# Patient Record
Sex: Male | Born: 1950
Health system: Southern US, Community
[De-identification: ages and names within clinical notes are randomized; demographics above are authoritative.]

## PROBLEM LIST (undated history)

## (undated) DIAGNOSIS — R251 Tremor, unspecified: Secondary | ICD-10-CM

## (undated) DIAGNOSIS — M109 Gout, unspecified: Secondary | ICD-10-CM

## (undated) DIAGNOSIS — E785 Hyperlipidemia, unspecified: Secondary | ICD-10-CM

## (undated) DIAGNOSIS — E119 Type 2 diabetes mellitus without complications: Secondary | ICD-10-CM

## (undated) DIAGNOSIS — I10 Essential (primary) hypertension: Secondary | ICD-10-CM

## (undated) DIAGNOSIS — E669 Obesity, unspecified: Secondary | ICD-10-CM

## (undated) HISTORY — PX: HERNIA REPAIR: SHX51

## (undated) HISTORY — DX: Tremor, unspecified: R25.1

## (undated) HISTORY — DX: Obesity, unspecified: E66.9

## (undated) HISTORY — DX: Type 2 diabetes mellitus without complications: E11.9

## (undated) HISTORY — DX: Gout, unspecified: M10.9

## (undated) HISTORY — DX: Hyperlipidemia, unspecified: E78.5

## (undated) HISTORY — DX: Essential (primary) hypertension: I10

---

## 1998-12-13 ENCOUNTER — Ambulatory Visit (HOSPITAL_COMMUNITY): Admission: RE | Admit: 1998-12-13 | Discharge: 1998-12-13 | Payer: Self-pay | Admitting: Gastroenterology

## 2010-06-05 ENCOUNTER — Encounter: Admission: RE | Admit: 2010-06-05 | Discharge: 2010-06-05 | Payer: Self-pay | Admitting: General Surgery

## 2010-06-11 ENCOUNTER — Ambulatory Visit (HOSPITAL_BASED_OUTPATIENT_CLINIC_OR_DEPARTMENT_OTHER): Admission: RE | Admit: 2010-06-11 | Discharge: 2010-06-11 | Payer: Self-pay | Admitting: General Surgery

## 2010-09-25 LAB — COMPREHENSIVE METABOLIC PANEL
Albumin: 4.2 g/dL (ref 3.5–5.2)
BUN: 12 mg/dL (ref 6–23)
Calcium: 9.4 mg/dL (ref 8.4–10.5)
Chloride: 102 mEq/L (ref 96–112)
Creatinine, Ser: 0.97 mg/dL (ref 0.4–1.5)
GFR calc Af Amer: >60 mL/min (ref >60)
Total Bilirubin: 0.7 mg/dL (ref 0.3–1.2)
Total Protein: 6.9 g/dL (ref 6.0–8.3)

## 2010-09-25 LAB — CBC
MCH: 28.2 pg (ref 26.0–34.0)
MCHC: 33 g/dL (ref 30.0–36.0)
MCV: 85.5 fL (ref 78.0–100.0)
Platelets: 205 10*3/uL (ref 150–400)
RDW: 14.4 % (ref 11.5–15.5)

## 2010-09-25 LAB — DIFFERENTIAL
Basophils Absolute: 0.1 10*3/uL (ref 0.0–0.1)
Lymphocytes Relative: 44 % (ref 12–46)
Lymphs Abs: 2.9 10*3/uL (ref 0.7–4.0)
Monocytes Absolute: 0.8 10*3/uL (ref 0.1–1.0)
Neutro Abs: 2.6 10*3/uL (ref 1.7–7.7)

## 2013-11-30 ENCOUNTER — Ambulatory Visit (INDEPENDENT_AMBULATORY_CARE_PROVIDER_SITE_OTHER): Payer: BC Managed Care – PPO | Admitting: Neurology

## 2013-11-30 ENCOUNTER — Encounter: Payer: Self-pay | Admitting: Neurology

## 2013-11-30 ENCOUNTER — Encounter (INDEPENDENT_AMBULATORY_CARE_PROVIDER_SITE_OTHER): Payer: Self-pay

## 2013-11-30 VITALS — BP 119/77 | HR 56 | Ht 71.0 in | Wt 279.0 lb

## 2013-11-30 DIAGNOSIS — E669 Obesity, unspecified: Secondary | ICD-10-CM | POA: Insufficient documentation

## 2013-11-30 DIAGNOSIS — G4730 Sleep apnea, unspecified: Secondary | ICD-10-CM

## 2013-11-30 DIAGNOSIS — R251 Tremor, unspecified: Secondary | ICD-10-CM | POA: Insufficient documentation

## 2013-11-30 DIAGNOSIS — G4710 Hypersomnia, unspecified: Secondary | ICD-10-CM

## 2013-11-30 DIAGNOSIS — E785 Hyperlipidemia, unspecified: Secondary | ICD-10-CM | POA: Insufficient documentation

## 2013-11-30 DIAGNOSIS — E119 Type 2 diabetes mellitus without complications: Secondary | ICD-10-CM

## 2013-11-30 DIAGNOSIS — R259 Unspecified abnormal involuntary movements: Secondary | ICD-10-CM

## 2013-11-30 NOTE — Progress Notes (Signed)
PATIENT: Evan Willis DOB: 1951-06-01  HISTORICAL  Evan Willis is a 63 years old African American male, ambidextrous, he is referred by his primary care physician Dr. Dorthy Cooler from [facility] for evaluation of bilateral hands tremor  He was recently evaluated in Nov 23 2013, per record, he has past medical history of hypertension, gout, diabetes.  He noticed bilateral hand tremor since 2013, before he retired, when he hold somethings, such as a cup of drink, his hand started to shake, he would spilled a cup, he also noticed handshaking way using fork to eat  When he take somethings in his hand, he had to hold them together to bring it to his mouth,   He denied loss sense of smell, no REM sleep disorder, but he tends to snore a lot, waking up a few times in the middle of the night, excessive fatigue, and sleepiness during the daytime  He is also overweight  Overpass one year, he has been on titrating dose of Inderal, currently taking 80 mg twice a day, heart rate is 56, blood pressure was 119 over 77 today, he is also on the blood pressure agent, including caduet, hydrochlorothiazide He denied a family history of tremor   REVIEW OF SYSTEMS: Full 14 system review of systems performed and notable only for weight gain, fatigue, swelling in legs, blurry vision, shortness of breath, snoring, incontinence, urination problems, impotence, numbness, weakness, sleepiness, snoring, not enough sleep  ALLERGIES: No Known Allergies  HOME MEDICATIONS: No current outpatient prescriptions on file prior to visit.   No current facility-administered medications on file prior to visit.    PAST MEDICAL HISTORY: Past Medical History  Diagnosis Date  . Diabetes   . High blood pressure   . Hyperlipemia   . Gout   . Tremor   . Obesity     PAST SURGICAL HISTORY: Past Surgical History  Procedure Laterality Date  . Hernia repair      FAMILY HISTORY: Family History  Problem  Relation Age of Onset  . Breast cancer Mother   . High blood pressure Father   . Asthma Brother   . Glaucoma Brother   . Heart disease Sister     SOCIAL HISTORY:  History   Social History  . Marital Status: Married    Spouse Name: N/A    Number of Children: 5  . Years of Education: Trade sch.   Occupational History  . 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 Smoking status: Former Research scientist (life sciences)  . Smokeless tobacco: Never Used     Comment: Quit in 2014  . Alcohol Use: No  . Drug Use: No  . Sexual Activity: Not on file   Other Topics Concern  . Not on file   Social History Narrative   Patient is married and lives at home with his wife Blanch Media)   Patient is retired and works part time self employed. 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   Both handed.   Caffeine 1-2 cups per week.           PHYSICAL EXAM   Filed Vitals:   11/30/13 1021  BP: 119/77  Pulse: 56  Height: $Remove'5\' 11"'jBgzpiK$  (1.803 m)  Weight: 279 lb (126.554 kg)    Not recorded    Body mass index is 38.93 kg/(m^2).   Generalized: In no acute distress  Neck: Supple, no carotid bruits   Cardiac:  Regular rate rhythm  Pulmonary: Clear to auscultation bilaterally  Musculoskeletal: No deformity  Neurological examination  Mentation: Alert oriented to time, place, history taking, and causual conversation, obese  Cranial nerve II-XII: Pupils were equal round reactive to light. Extraocular movements were full.  Visual field were full on confrontational test. Bilateral fundi were sharp.  Facial sensation and strength were normal. Hearing was intact to finger rubbing bilaterally. Uvula tongue midline.  Head turning and shoulder shrug and were normal and symmetric.Tongue protrusion into cheek strength was normal.  Motor: Normal tone, bulk and strength with exception of mild bilateral hands postural tremor.  Sensory: Intact to fine touch, pinprick, preserved  vibratory sensation, and proprioception at toes.  Coordination: Normal finger to nose, heel-to-shin bilaterally there was no truncal ataxia  Gait: Rising up from seated position without assistance, normal stance, without trunk ataxia, moderate stride, good arm swing, smooth turning, able to perform tiptoe, and heel walking without difficulty.   Romberg signs: Negative  Deep tendon reflexes: Brachioradialis 2/2, biceps 2/2, triceps 2/2, patellar 2/2, Achilles 2/2, plantar responses were flexor bilaterally.   DIAGNOSTIC DATA (LABS, IMAGING, TESTING) - I reviewed patient records, labs, notes, testing and imaging myself where available.  Lab Results  Component Value Date   WBC 6.6 06/05/2010   HGB 13.9 06/05/2010   HCT 42.2 06/05/2010   MCV 85.5 06/05/2010   PLT 205 06/05/2010      Component Value Date/Time   NA 141 06/05/2010 1220   K 4.1 06/05/2010 1220   CL 102 06/05/2010 1220   CO2 29 06/05/2010 1220   GLUCOSE 75 06/05/2010 1220   BUN 12 06/05/2010 1220   CREATININE 0.97 06/05/2010 1220   CALCIUM 9.4 06/05/2010 1220   PROT 6.9 06/05/2010 1220   ALBUMIN 4.2 06/05/2010 1220   AST 20 06/05/2010 1220   ALT 18 06/05/2010 1220   ALKPHOS 55 06/05/2010 1220   BILITOT 0.7 06/05/2010 1220   GFRNONAA >60 06/05/2010 1220   GFRAA  Value: >60        The eGFR has been calculated using the MDRD equation. This calculation has not been validated in all clinical situations. eGFR's persistently <60 mL/min signify possible Chronic Kidney Disease. 06/05/2010 1220    ASSESSMENT AND PLAN  Evan Willis is a 63 y.o. male complains of  Few years history of bilateral hands tremor, no parkinsonian features, no family history of tremor, most suggestive of essential tremor, we will check of thyroid functional test, his history also suggestive of obstructive sleep apnea, we will proceed with sleep study    Marcial Pacas, M.D. Ph.D.  [facility] [address] [place],   [address] [phone]

## 2013-12-01 ENCOUNTER — Telehealth: Payer: Self-pay | Admitting: Neurology

## 2013-12-01 DIAGNOSIS — R0683 Snoring: Secondary | ICD-10-CM

## 2013-12-01 DIAGNOSIS — G479 Sleep disorder, unspecified: Secondary | ICD-10-CM

## 2013-12-01 DIAGNOSIS — R400 Somnolence: Secondary | ICD-10-CM

## 2013-12-01 DIAGNOSIS — E669 Obesity, unspecified: Secondary | ICD-10-CM

## 2013-12-01 LAB — THYROID PANEL WITH TSH
FREE THYROXINE INDEX: 2 (ref 1.2–4.9)
T3 Uptake Ratio: 29 % (ref 24–39)
T4, Total: 6.9 ug/dL (ref 4.5–12.0)
TSH: 0.982 u[IU]/mL (ref 0.450–4.500)

## 2013-12-01 LAB — FOLATE: Folate: 13.2 ng/mL (ref >3.0)

## 2013-12-01 LAB — VITAMIN B12: VITAMIN B 12: 469 pg/mL (ref 211–946)

## 2013-12-01 NOTE — Telephone Encounter (Signed)
Sleep study request review: This patient has an underlying medical history of obesity, hypertension, hyperlipidemia, gout and tremors, and is referred by Dr. Terrace ArabiaYan for an attended sleep study due to a report of snoring, nighttime awakenings, excessive daytime somnolence. I will order a split-night sleep study and see the patient in sleep medicine consultation afterwards. Huston FoleySaima Keiona Jenison, MD, PhD [facility])

## 2013-12-01 NOTE — Telephone Encounter (Signed)
Dr. Carter KittenYan,refers patient for attended sleep study.  Height: 5'11  Weight: 279 lbs.  BMI: 38.93  Past Medical History: Diabetes  High blood pressure  Hyperlipemia  Gout  Tremor  Obesity   Sleep Symptoms: Patient tends to snore a lot, waking up a few times in the middle of the night, excessive fatigue, and sleepiness during the daytime   Epworth Score: 7 ( I called and spoke with patient to get his ESS score)  Medications:  Albuterol Sulfate (Aero Soln) PROVENTIL HFA;VENTOLIN HFA 108 (90 BASE) MCG/ACT Inhale into the lungs every 6 (six) hours as needed for wheezing or shortness of breath. Allopurinol (Tab) ZYLOPRIM 100 MG Take 100 mg by mouth daily. Amlodipine-Atorvastatin (Tab) CADUET 10-40 MG Take 1 tablet by mouth daily. Aspirin (Tab) aspirin 81 MG Take 81 mg by mouth daily. Doxazosin Mesylate (Tab) CARDURA 8 MG Take 8 mg by mouth daily. GlipiZIDE (Tab) GLUCOTROL 10 MG Take 10 mg by mouth daily before breakfast. Hydrochlorothiazide (Tab) HYDRODIURIL 25 MG Take 25 mg by mouth daily. Lisinopril (Tab) PRINIVIL,ZESTRIL 40 MG Take 40 mg by mouth daily. MetFORMIN HCl (Tablet SR 24 hr) GLUMETZA 1000 MG (MOD) Take 1,000 mg by mouth daily with breakfast. Pioglitazone HCl (Tab) ACTOS 45 MG Take 45 mg by mouth daily. Potassium Citrate POTASSIUM CITRATE PO Take by mouth daily. Propranolol HCl (Tab) INDERAL 80 MG Take 80 mg by mouth 2 (two) times daily. Vardenafil HCl (Tab) LEVITRA 20 MG Take 20 mg by mouth daily as needed for erectile dysfunction   Insurance: [facility]  Dr. Zannie CoveYan's Assessment and Plan: Evan SauceDavid Willis is a 63 y.o. male complains of Few years history of bilateral hands tremor, no parkinsonian features, no family history of tremor, most suggestive of essential tremor, we will check of thyroid functional test, his history also suggestive of obstructive sleep apnea, we will proceed with sleep study    Please review patient information and submit instructions for scheduling and orders  for sleep technologist.  Thank you!

## 2013-12-09 NOTE — Progress Notes (Signed)
Quick Note:  Shared normal labs with patient and he verbalized understanding ______

## 2014-01-13 ENCOUNTER — Ambulatory Visit (INDEPENDENT_AMBULATORY_CARE_PROVIDER_SITE_OTHER): Payer: BC Managed Care – PPO | Admitting: Neurology

## 2014-01-13 DIAGNOSIS — G479 Sleep disorder, unspecified: Secondary | ICD-10-CM

## 2014-01-13 DIAGNOSIS — R0683 Snoring: Secondary | ICD-10-CM

## 2014-01-13 DIAGNOSIS — R0902 Hypoxemia: Secondary | ICD-10-CM

## 2014-01-13 DIAGNOSIS — G4710 Hypersomnia, unspecified: Secondary | ICD-10-CM

## 2014-01-13 DIAGNOSIS — G4733 Obstructive sleep apnea (adult) (pediatric): Secondary | ICD-10-CM

## 2014-01-13 DIAGNOSIS — R400 Somnolence: Secondary | ICD-10-CM

## 2014-01-13 DIAGNOSIS — E669 Obesity, unspecified: Secondary | ICD-10-CM

## 2014-01-26 ENCOUNTER — Telehealth: Payer: Self-pay | Admitting: Neurology

## 2014-01-26 DIAGNOSIS — G4733 Obstructive sleep apnea (adult) (pediatric): Secondary | ICD-10-CM

## 2014-01-26 DIAGNOSIS — G4734 Idiopathic sleep related nonobstructive alveolar hypoventilation: Secondary | ICD-10-CM

## 2014-01-26 DIAGNOSIS — R9431 Abnormal electrocardiogram [ECG] [EKG]: Secondary | ICD-10-CM

## 2014-01-26 NOTE — Telephone Encounter (Signed)
Please call and notify patient that the recent sleep study confirmed the diagnosis of OSA, which is severe. He did well with CPAP during the study with significant improvement of the respiratory events. Therefore, I would like start the patient on CPAP at home. I placed the order in the chart.   Arrange for CPAP set up at home through a DME company of patient's choice and fax/route report to PCP and referring MD (if other than PCP).   The patient will also need a follow up appointment with me in 6-8 weeks post set up that has to be scheduled; help the patient schedule this (in a follow-up slot).   Please re-enforce the importance of compliance with treatment and the need for us to monitor compliance data.   Once you have spoken to the patient and scheduled the return appointment, you may close this encounter, thanks,   Huston FoleySaima Jenne Sellinger, MD, PhD [facility] ([facility]

## 2014-01-31 ENCOUNTER — Encounter: Payer: Self-pay | Admitting: Neurology

## 2014-03-30 ENCOUNTER — Encounter: Payer: Self-pay | Admitting: Neurology

## 2014-03-30 NOTE — Progress Notes (Signed)
Quick Note:  I reviewed the patient's CPAP compliance data from 02/12/2014 to 03/13/2014, which is a total of 30 days, during which time the patient used CPAP every day. The average usage for all days was 7 hours and 10 minutes. The percent used days greater than 4 hours was 100 %, indicating superb compliance. The residual AHI was 4.6 per hour, indicating an adequate treatment pressure of 11 cwp with EPR of 3. Air leak from the mask was acceptable at 13.9 L per minute at the 95th percentile. We will review this data with the patient at the next office visit, which is currently routinely scheduled for 06/02/2014 at 10:30 AM with nurse practitioner Darrol Angel.  Huston Foley, MD, PhD [facility] ([facility]   ______

## 2014-04-14 ENCOUNTER — Encounter: Payer: Self-pay | Admitting: Neurology

## 2014-05-01 NOTE — Progress Notes (Signed)
Quick Note:  I reviewed the patient's CPAP compliance data from 03/15/2014 to 04/13/2014, which is a total of 30 days, during which time the patient used CPAP every day. The average usage for all days was 7 hours and 23 minutes. The percent used days greater than 4 hours was 97%, indicating excellent compliance. The residual AHI was 2.3 per hour, indicating an appropriate treatment pressure of 11 cwp with EPR of 3. Air leak from the mask was acceptable at 16.2 L per minute at the 95th percentile. I will review this data with the patient at the next office visit, which is currently not scheduled as yet. We will get in touch with the patient regarding his appointment in sleep clinic.  Huston FoleySaima Dugan Vanhoesen, MD, PhD [facility] ([facility]   ______

## 2014-05-12 ENCOUNTER — Encounter: Payer: Self-pay | Admitting: Neurology

## 2014-05-13 ENCOUNTER — Encounter: Payer: Self-pay | Admitting: Neurology

## 2014-05-13 ENCOUNTER — Ambulatory Visit (INDEPENDENT_AMBULATORY_CARE_PROVIDER_SITE_OTHER): Payer: BC Managed Care – PPO | Admitting: Neurology

## 2014-05-13 VITALS — BP 124/78 | HR 58 | Temp 97.9°F | Wt 285.0 lb

## 2014-05-13 DIAGNOSIS — Z9989 Dependence on other enabling machines and devices: Principal | ICD-10-CM

## 2014-05-13 DIAGNOSIS — G4733 Obstructive sleep apnea (adult) (pediatric): Secondary | ICD-10-CM

## 2014-05-13 DIAGNOSIS — G4734 Idiopathic sleep related nonobstructive alveolar hypoventilation: Secondary | ICD-10-CM

## 2014-05-13 NOTE — Progress Notes (Signed)
Subjective:    Patient ID: Evan Willis is a 63 y.o. male.  HPI    Huston Foley, MD, PhD [facility] [address], [address] [address] [place], [place] [address]  Dear Vivia Ewing,   I saw your patient, Evan Willis, upon your kind request in my clinic today for initial consultation of his obstructive sleep apnea, after his recent sleep study. The patient is unaccompanied today. As you know, Evan Willis is a 63 year old right-handed gentleman with an underlying medical history of obesity, diabetes, gout, hyperlipidemia, hypertension, as well as tremors, who you had referred for a sleep study due to a report of snoring, middle of the night awakenings and daytime somnolence. He had a split night sleep study on 01/13/2014 and went over his test results with him in detail today. Baseline sleep efficiency was 81.4% with a latency to sleep of 16.5 minutes and wake after sleep onset of 13.5 minutes with mild sleep fragmentation noted. He had an increased percentage of stage II sleep, absence of slow-wave sleep, and 11.1% of REM sleep with a mildly reduced REM latency. He had infrequent PVCs on EKG. He had no significant periodic leg movements of sleep. He had moderate to loud snoring. He slept mostly on his sides. His total AHI was high at 77.9 per hour, with a baseline oxygen saturation of only 89%, and a nadir of 68%. He was therefore titrated on CPAP. His sleep efficiency was high at 96.8%. He had an increased percentage of stage II sleep, absence of slow-wave sleep, and an increased percentage of REM sleep at 31%. Average oxygen saturation was 88% with a nadir of 82%. Time below 88% saturation was 2 minutes and 44 seconds. CPAP was titrated from 5-12 cm and his AHI was 4.2 per hour on a pressure of 11 cm. Based on the test results I started the patient on CPAP. I felt he may need an overnight pulse oximetry test once he is established on CPAP therapy to make sure his nocturnal hypoxemia  has improved.  I reviewed the patient's CPAP compliance data from 02/12/2014 to 03/13/2014, which is a total of 30 days, during which time the patient used CPAP every day. The average usage for all days was 7 hours and 10 minutes. The percent used days greater than 4 hours was 100 %, indicating superb compliance. The residual AHI was 4.6 per hour, indicating an adequate treatment pressure of 11 cwp with EPR of 3. Air leak from the mask was acceptable at 13.9 L per minute at the 95th percentile. I reviewed the patient's CPAP compliance data from 03/15/2014 to 04/13/2014, which is a total of 30 days, during which time the patient used CPAP every day. The average usage for all days was 7 hours and 23 minutes. The percent used days greater than 4 hours was 97%, indicating excellent compliance. The residual AHI was 2.3 per hour, indicating an appropriate treatment pressure of 11 cwp with EPR of 3. Air leak from the mask was acceptable at 16.2 L per minute at the 95th percentile.  Today, I reviewed his compliance data from 04/12/2014 through 05/11/2014 which is a total of 30 days during which time he used his machine 29 days. Percent used days greater than 4 hours was 93%, average usage of 7 hours and 30 minutes, residual AHI at 2.3 per hour leak acceptable at 20.7 for the 95th percentile. Pressure still at 11 cm with EPR of 3.  Today, he reports that his sleep  is improved. He has to get up less to use the bathroom. Whereas it was 3-5 times per night on average before, now it is 2-3 times per night. He goes to bed around 10. He watches TV in bed. His 3720-month-old grandchild is with them in bed as they take care of him overnight since his daughter works nights. TV stays on all night. He gets up between 3 and 6 AM. This is primarily by habit. He feels better rested. His Epworth sleepiness score today is 2. He feels he has done well with CPAP. He did skip a night recently on a Sunday as he went fishing. He did have  some nasal soreness with a large nasal pillows so currently he is on a medium. He has noted a little bit more nasal drainage since starting CPAP.  He drinks 1-2 cups of coffee per day. He quit smoking in '86.    His Past Medical History Is Significant For: Past Medical History  Diagnosis Date  . Diabetes   . High blood pressure   . Hyperlipemia   . Gout   . Tremor   . Obesity     His Past Surgical History Is Significant For: Past Surgical History  Procedure Laterality Date  . Hernia repair      His Family History Is Significant For: Family History  Problem Relation Age of Onset  . Breast cancer Mother   . High blood pressure Father   . Asthma Brother   . Glaucoma Brother   . Heart disease Sister     His Social History Is Significant For: History   Social History  . Marital Status: Married    Spouse Name: N/A    Number of Children: 5  . Years of Education: Trade sch.   Occupational History  . Retired/ [employer]ime      Government social research officerlectrial Supervisor   Social History Main Topics  . Smoking status: Former Games developermoker  . Smokeless tobacco: Never Used     Comment: Quit in 2014  . Alcohol Use: No  . Drug Use: No  . Sexual Activity: None   Other Topics Concern  . None   Social History Narrative   Patient is married and lives at home with his wife Alona Bene(Joyce)   Patient is retired and works part time self employed. 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   Both handed.   Caffeine 1-2 cups per week.          His Allergies Are:  No Known Allergies:   His Current Medications Are:  Outpatient Encounter Prescriptions as of 05/13/2014  Medication Sig  . albuterol (PROVENTIL HFA;VENTOLIN HFA) 108 (90 BASE) MCG/ACT inhaler Inhale into the lungs every 6 (six) hours as needed for wheezing or shortness of breath.  . allopurinol (ZYLOPRIM) 100 MG tablet Take 100 mg by mouth 2 (two) times daily.   Marland Kitchen. amLODipine-atorvastatin (CADUET) 10-40 MG per tablet Take 1 tablet by  mouth daily.  Marland Kitchen. aspirin 81 MG tablet Take 81 mg by mouth daily.  Marland Kitchen. doxazosin (CARDURA) 8 MG tablet Take 8 mg by mouth daily.  Marland Kitchen. glipiZIDE (GLUCOTROL) 10 MG tablet Take 10 mg by mouth daily before breakfast.  . hydrochlorothiazide (HYDRODIURIL) 25 MG tablet Take 25 mg by mouth daily.  Marland Kitchen. lisinopril (PRINIVIL,ZESTRIL) 40 MG tablet Take 40 mg by mouth daily.  . metFORMIN (GLUMETZA) 1000 MG (MOD) 24 hr tablet Take 1,000 mg by mouth 2 (two) times daily.   . pioglitazone (ACTOS)  45 MG tablet Take 45 mg by mouth daily.  Marland Kitchen. POTASSIUM CITRATE PO Take 10 mg by mouth daily.   . propranolol (INDERAL) 80 MG tablet Take 80 mg by mouth 2 (two) times daily.  . [DISCONTINUED] vardenafil (LEVITRA) 20 MG tablet Take 20 mg by mouth daily as needed for erectile dysfunction.  :  Review of Systems:  Out of a complete 14 point review of systems, all are reviewed and negative with the exception of these symptoms as listed below:   Review of Systems  Neurological:       Sleep apnea    Objective:  Neurologic Exam  Physical Exam Physical Examination:   Filed Vitals:   05/13/14 0852  BP: 124/78  Pulse: 58  Temp: 97.9 F (36.6 C)    General Examination: The patient is a very pleasant 63 y.o. male in no acute distress. He appears well-developed and well-nourished and well groomed.   HEENT: Normocephalic, atraumatic, pupils are equal, round and reactive to light and accommodation. Funduscopic exam is normal with sharp disc margins noted. Extraocular tracking is good without limitation to gaze excursion or nystagmus noted. Normal smooth pursuit is noted. Hearing is grossly intact. Tympanic membranes are clear bilaterally. Face is symmetric with normal facial animation and normal facial sensation. Speech is clear with no dysarthria noted. There is no hypophonia. There is no lip, neck/head, jaw or voice tremor. Neck is supple with full range of passive and active motion. There are no carotid bruits on auscultation.  Oropharynx exam reveals: moderate mouth dryness, adequate dental hygiene and marked airway crowding, due to large tongue and redundant soft palate and wider uvula. Tonsils are not visualized. Mallampati is class III. Tongue protrudes centrally and palate elevates symmetrically. Neck size is 17.5 inches. He has a very mild overbite. Nasal inspection reveals no significant nasal mucosal bogginess or redness and no septal deviation.   Chest: Clear to auscultation without wheezing, rhonchi or crackles noted.  Heart: S1+S2+0, regular and normal without murmurs, rubs or gallops noted.   Abdomen: Soft, non-tender and non-distended with normal bowel sounds appreciated on auscultation.  Extremities: There is trace pitting edema in the distal lower extremities bilaterally. Pedal pulses are intact.  Skin: Warm and dry without trophic changes noted. There are no varicose veins.  Musculoskeletal: exam reveals no obvious joint deformities, tenderness or joint swelling or erythema.   Neurologically:  Mental status: The patient is awake, alert and oriented in all 4 spheres. His immediate and remote memory, attention, language skills and fund of knowledge are appropriate. There is no evidence of aphasia, agnosia, apraxia or anomia. Speech is clear with normal prosody and enunciation. Thought process is linear. Mood is normal and affect is normal.  Cranial nerves II - XII are as described above under HEENT exam. In addition: shoulder shrug is normal with equal shoulder height noted. Motor exam: Normal bulk, strength and tone is noted. There is no drift, tremor or rebound, except minimal L>R postural and action tremor in both hands. Romberg is negative. Reflexes are 1+ in the UEs and diminished in the LEs. Fine motor skills and coordination: intact with normal finger taps, normal hand movements, normal rapid alternating patting, normal foot taps and normal foot agility.  Cerebellar testing: No dysmetria or intention  tremor on finger to nose testing. Heel to shin is unremarkable bilaterally. There is no truncal or gait ataxia.  Sensory exam: intact to light touch, pinprick, vibration, temperature sense in the upper and lower extremities with the  exception of mild decrease in temp sense in the distal LEs.  Gait, station and balance: He stands easily. No veering to one side is noted. No leaning to one side is noted. Posture is age-appropriate and stance is narrow based. Gait shows normal stride length and normal pace. No problems turning are noted. He turns en bloc. Tandem walk is unremarkable. Intact toe and heel stance is noted.               Assessment and Plan:   In summary, Evan Willis is a very pleasant 63 y.o.-year old male with an underlying medical history of obesity, diabetes, gout, hyperlipidemia, hypertension, as well as tremors, who recently has been diagnosed with severe obstructive sleep apnea based on a split-night sleep study in July. Today I went over his test results with him in detail today explaining the findings and we also talked about his compliance data in detail. He has been on CPAP therapy since 8/15 with great results and full compliance. He is commended on being compliant with treatment and congratulated on his success. He did have some residual oxygen desaturations while on the final pressures of CPAP and I would like to proceed with an overnight pulse oximetry test while he is on CPAP therapy to ensure that his oxygen saturations continue to be maintained while he is on therapy. He is in agreement. He does report being out-of-town for a week starting tomorrow so we will do this when he is back. He is reminded to take his machine with him. He is planning on taking his CPAP machine with him. He has an appointment with Aundra Millet, NP next month to follow-up on his tremors. He feels he is largely unchanged regarding his tremors. Overall as far as his sleep is concerned he feels improved. I discussed  the diagnosis of obstructive sleep apnea its prognosis and treatment options with him. He is particularly reminded about the risks and ramifications of untreated OSA , especially with respect to developing cardiovascular disease down the Road, including congestive heart failure, difficult to treat hypertension, cardiac arrhythmias, or stroke. Even type 2 diabetes has, in part, been linked to untreated OSA. Symptoms of untreated OSA include daytime sleepiness, memory problems, mood irritability and mood disorder such as depression and anxiety, lack of energy, as well as recurrent headaches, especially morning headaches. We talked about trying to maintain a healthy lifestyle in general, as well as the importance of weight control. I encouraged the patient to eat healthy, exercise daily and keep well hydrated, to keep a scheduled bedtime and wake time routine, to not skip any meals and eat healthy snacks in between meals. I advised the patient not to drive when feeling sleepy. I explained the importance of being compliant with PAP treatment, not only for insurance purposes but primarily to improve His symptoms, and for the patient's long term health benefit, including to reduce His cardiovascular risks. I answered all his questions today and the patient was in agreement. I would like to see him back in about 3 months for sleep apnea checkup. In the interim, we will call him with his pulse oximetry test results as well.  Thank you very much for allowing me to participate in the care of this nice patient. If I can be of any further assistance to you please do not hesitate to call me.  Sincerely,   Huston Foley, MD, PhD

## 2014-05-13 NOTE — Patient Instructions (Signed)
Please continue using your CPAP regularly. While your insurance requires that you use CPAP at least 4 hours each night on 70% of the nights, I recommend, that you not skip any nights and use it throughout the night if you can. Getting used to CPAP and staying with the treatment long term does take time and patience and discipline. Untreated obstructive sleep apnea when it is moderate to severe can have an adverse impact on cardiovascular health and raise her risk for heart disease, arrhythmias, hypertension, congestive heart failure, stroke and diabetes. Untreated obstructive sleep apnea causes sleep disruption, nonrestorative sleep, and sleep deprivation. This can have an impact on your day to day functioning and cause daytime sleepiness and impairment of cognitive function, memory loss, mood disturbance, and problems focussing. Using CPAP regularly can improve these symptoms.  We will do an overnight oxygen test and see if your oxygen levels are okay while on CPAP.   We will call you with the results.   I will see you back in 3 months.

## 2014-05-18 ENCOUNTER — Other Ambulatory Visit: Payer: Self-pay | Admitting: *Deleted

## 2014-05-18 DIAGNOSIS — G4733 Obstructive sleep apnea (adult) (pediatric): Secondary | ICD-10-CM

## 2014-05-18 DIAGNOSIS — Z9989 Dependence on other enabling machines and devices: Principal | ICD-10-CM

## 2014-05-18 DIAGNOSIS — G4734 Idiopathic sleep related nonobstructive alveolar hypoventilation: Secondary | ICD-10-CM

## 2014-06-02 ENCOUNTER — Encounter: Payer: Self-pay | Admitting: Adult Health

## 2014-06-02 ENCOUNTER — Encounter (INDEPENDENT_AMBULATORY_CARE_PROVIDER_SITE_OTHER): Payer: Self-pay | Admitting: Adult Health

## 2014-06-02 DIAGNOSIS — Z0289 Encounter for other administrative examinations: Secondary | ICD-10-CM

## 2014-06-02 NOTE — Progress Notes (Signed)
This encounter was created in error - please disregard.

## 2014-07-05 ENCOUNTER — Ambulatory Visit: Payer: BC Managed Care – PPO | Admitting: Adult Health

## 2014-08-11 ENCOUNTER — Encounter: Payer: Self-pay | Admitting: Adult Health

## 2014-08-11 ENCOUNTER — Ambulatory Visit (INDEPENDENT_AMBULATORY_CARE_PROVIDER_SITE_OTHER): Payer: BC Managed Care – PPO | Admitting: Adult Health

## 2014-08-11 ENCOUNTER — Encounter: Payer: Self-pay | Admitting: Neurology

## 2014-08-11 VITALS — BP 142/80 | HR 49 | Ht 71.0 in | Wt 281.0 lb

## 2014-08-11 DIAGNOSIS — R200 Anesthesia of skin: Secondary | ICD-10-CM

## 2014-08-11 DIAGNOSIS — Z9989 Dependence on other enabling machines and devices: Secondary | ICD-10-CM

## 2014-08-11 DIAGNOSIS — G4733 Obstructive sleep apnea (adult) (pediatric): Secondary | ICD-10-CM

## 2014-08-11 DIAGNOSIS — R251 Tremor, unspecified: Secondary | ICD-10-CM

## 2014-08-11 DIAGNOSIS — R208 Other disturbances of skin sensation: Secondary | ICD-10-CM

## 2014-08-11 NOTE — Progress Notes (Signed)
I have reviewed and agreed above plan. 

## 2014-08-11 NOTE — Patient Instructions (Signed)
CPAP download is excellent Continue Propranolol.  Try wearing a brace on the left hand to help wih numbness from possible carpal tunnel syndrome.

## 2014-08-11 NOTE — Progress Notes (Addendum)
PATIENT: Evan Willis DOB: [birth year]  REASON FOR VISIT: follow up HISTORY FROM: patient  HISTORY OF PRESENT ILLNESS: Mr. Evan Willis is a 64 year old male with a history of tremor and obstructive sleep apnea on CPAP. He returns today for follow-up. The patient is currently taking Inderal 80 mg twice a day. The patient feels that his tremor in the left hand has gotten slightly worse. He also feels that the left arm can feel numb at times. He states the hand is normally numb in the morning and will improve once he moves it. He states that in the forearm and bicep he has an "achiness" and numbness that he feels is pretty constant. He feels that overtime that hand has gotten weaker as well.  He is able to complete all ADLs independently. Although will tends to drop things if holding them with the left hand. He was recently diagnosed with obstructive sleep apnea. He has been using his CPAP nightly. His last download in October with Dr. Rexene Alberts showed excellent compliance. Today his download shows an AHI of 1.7 at  11 cm of water, uses his machine on average for 8 hours and 52 minutes a night, with 100% compliance. His Epworth score is 3 points was previously 2 points.  Since the last visit the patient has had no new medical issues.    HISTORY 11/30/13 [facility]): Evan Willis is a 64 years old African American male, ambidextrous, he is referred by his primary care physician Dr. Dorthy Cooler from [facility] for evaluation of bilateral hands tremor. He was recently evaluated in Nov 23 2013, per record, he has past medical history of hypertension, gout, diabetes.He noticed bilateral hand tremor since 2013, before he retired, when he hold somethings, such as a cup of drink, his hand started to shake, he would spilled a cup, he also noticed handshaking way using fork to eat.When he take somethings in his hand, he had to hold them together to bring it to his mouth, He denied loss sense of smell, no REM sleep  disorder, but he tends to snore a lot, waking up a few times in the middle of the night, excessive fatigue, and sleepiness during the daytime. He is also overweight.Overpass one year, he has been on titrating dose of Inderal, currently taking 80 mg twice a day, heart rate is 56, blood pressure was 119 over 77 today, he is also on the blood pressure agent, including caduet, hydrochlorothiazide. He denied a family history of tremor  REVIEW OF SYSTEMS: Out of a complete 14 system review of symptoms, the patient complains only of the following symptoms, and all other reviewed systems are negative.  Frequency of urination, numbness, tremors, apnea, eye discharge, eye redness, blurred vision,   ALLERGIES: No Known Allergies  HOME MEDICATIONS: Outpatient Prescriptions Prior to Visit  Medication Sig Dispense Refill  . albuterol (PROVENTIL HFA;VENTOLIN HFA) 108 (90 BASE) MCG/ACT inhaler Inhale into the lungs every 6 (six) hours as needed for wheezing or shortness of breath.    . allopurinol (ZYLOPRIM) 100 MG tablet Take 100 mg by mouth 2 (two) times daily.     Marland Kitchen amLODipine-atorvastatin (CADUET) 10-40 MG per tablet Take 1 tablet by mouth daily.    Marland Kitchen aspirin 81 MG tablet Take 81 mg by mouth daily.    Marland Kitchen doxazosin (CARDURA) 8 MG tablet Take 8 mg by mouth daily.    Marland Kitchen glipiZIDE (GLUCOTROL) 10 MG tablet Take 10 mg by mouth daily before breakfast.    . hydrochlorothiazide (  HYDRODIURIL) 25 MG tablet Take 25 mg by mouth daily.    Marland Kitchen lisinopril (PRINIVIL,ZESTRIL) 40 MG tablet Take 40 mg by mouth daily.    . metFORMIN (GLUCOPHAGE) 1000 MG tablet   11  . metFORMIN (GLUMETZA) 1000 MG (MOD) 24 hr tablet Take 1,000 mg by mouth 2 (two) times daily.     . pioglitazone (ACTOS) 45 MG tablet Take 45 mg by mouth daily.    Marland Kitchen POTASSIUM CITRATE PO Take 10 mg by mouth daily.     . propranolol (INDERAL) 80 MG tablet Take 80 mg by mouth 2 (two) times daily.     No facility-administered medications prior to visit.    PAST  MEDICAL HISTORY: Past Medical History  Diagnosis Date  . Diabetes   . High blood pressure   . Hyperlipemia   . Gout   . Tremor   . Obesity     PAST SURGICAL HISTORY: Past Surgical History  Procedure Laterality Date  . Hernia repair      FAMILY HISTORY: Family History  Problem Relation Age of Onset  . Breast cancer Mother   . High blood pressure Father   . Asthma Brother   . Glaucoma Brother   . Heart disease Sister     PHYSICAL EXAM  Filed Vitals:   08/11/14 0851  BP: 142/80  Pulse: 49  Height: _0  (1.803 m)  Weight: 281 lb (127.461 kg)   Body mass index is 39.21 kg/(m^2).  Generalized: Well developed, in no acute distress   Neurological examination  Mentation: Alert oriented to time, place, history taking. Follows all commands speech and language fluent Cranial nerve II-XII: Pupils were equal round reactive to light. Extraocular movements were full, visual field were full on confrontational test. Facial sensation and strength were normal. Uvula tongue midline. Head turning and shoulder shrug  were normal and symmetric. Motor: The motor testing reveals 5 over 5 strength of all 4 extremities. Good symmetric motor tone is noted throughout. Tinel sign + on the left. Very mild tremor noted in the left hand. Sensory: Sensory testing is intact to soft touch and pinprick on all 4 extremities. No evidence of extinction is noted.  Coordination: Cerebellar testing reveals good finger-nose-finger and heel-to-shin bilaterally.  Gait and station: Gait is normal.  Romberg is negative. No drift is seen.  Reflexes: Deep tendon reflexes are symmetric and normal bilaterally.   DIAGNOSTIC DATA (LABS, IMAGING, TESTING) - I reviewed patient records, labs, notes, testing and imaging myself where available.      Component Value Date/Time   NA 141 06/05/2010 1220   K 4.1 06/05/2010 1220   CL 102 06/05/2010 1220   CO2 29 06/05/2010 1220   GLUCOSE 75 06/05/2010 1220   BUN 12  06/05/2010 1220   CREATININE 0.97 06/05/2010 1220   CALCIUM 9.4 06/05/2010 1220   PROT 6.9 06/05/2010 1220   ALBUMIN 4.2 06/05/2010 1220   AST 20 06/05/2010 1220   ALT 18 06/05/2010 1220   ALKPHOS 55 06/05/2010 1220   BILITOT 0.7 06/05/2010 1220   GFRNONAA >60 06/05/2010 1220   GFRAA  06/05/2010 1220    >60        The eGFR has been calculated using the MDRD equation. This calculation has not been validated in all clinical situations. eGFR's persistently <60 mL/min signify possible Chronic Kidney Disease.   ASSESSMENT AND PLAN 63 y.o. year old male  has a past medical history of Diabetes; High blood pressure; Hyperlipemia; Gout; Tremor; and Obesity. here  with:  1. Tremor 2. OSA on CPAP 3. Possible carpal tunnel syndrome on the left.   The patient CPAP download is excellent. He should continue using his CPAP nightly. He will follow-up with Dr. Rexene Alberts in 2-3 months. The patient feels that his tremor has gotten slightly worse on the left. He is also experiencing numbness in the left hand mainly in the mornings. He also describes an "achy feeling" in his left forearm and bicep. On exam tremor was very mild. No weakness or numbness noted in the left arm. However Tinels sign was positive. The patient could have carpal tunnel syndrome in the left hand. I explained that we could do NCS with EMG to confirm this diagnosis. The patient would like to defer for now. He states that he will try wearing a brace on the left wrist and see if that offers him any benefit. He was advised to let us know if his symptoms got worse. Patient verbalized understanding. He will continue taking propranolol for the tremor. His pulse is 49 and he typically runs in the 50s. We will continue to monitor this.  He will follow-up in 6 months or sooner with myself or Dr. Krista Blue.   Ward Givens, MSN, NP-C 08/11/2014, 8:54 AM [facility] [address], [place], [place] [address] [phone]  Note: This document was prepared with digital dictation and possible smart phrase technology. Any transcriptional errors that result from this process are unintentional.  I reviewed the above note and documentation by the Nurse Practitioner and agree with the history, physical exam, assessment and plan as outlined above. I was immediately available for face-to-face consultation. Star Age, MD, PhD [facility])

## 2014-08-15 ENCOUNTER — Ambulatory Visit: Payer: BC Managed Care – PPO | Admitting: Neurology

## 2014-11-10 ENCOUNTER — Encounter: Payer: Self-pay | Admitting: Neurology

## 2014-11-10 ENCOUNTER — Telehealth: Payer: Self-pay | Admitting: Neurology

## 2014-11-10 ENCOUNTER — Ambulatory Visit (INDEPENDENT_AMBULATORY_CARE_PROVIDER_SITE_OTHER): Payer: BC Managed Care – PPO | Admitting: Neurology

## 2014-11-10 VITALS — BP 132/67 | HR 52 | Ht 71.0 in | Wt 282.0 lb

## 2014-11-10 DIAGNOSIS — G4733 Obstructive sleep apnea (adult) (pediatric): Secondary | ICD-10-CM

## 2014-11-10 DIAGNOSIS — R251 Tremor, unspecified: Secondary | ICD-10-CM

## 2014-11-10 DIAGNOSIS — E669 Obesity, unspecified: Secondary | ICD-10-CM | POA: Diagnosis not present

## 2014-11-10 DIAGNOSIS — G4734 Idiopathic sleep related nonobstructive alveolar hypoventilation: Secondary | ICD-10-CM | POA: Diagnosis not present

## 2014-11-10 DIAGNOSIS — Z9989 Dependence on other enabling machines and devices: Principal | ICD-10-CM

## 2014-11-10 NOTE — Progress Notes (Addendum)
Subjective:    Patient ID: Evan Willis is a 64 y.o. male.  HPI    Interim history:  Evan Willis is a 64 year old right-handed gentleman with an underlying medical history of obesity, diabetes, gout, hyperlipidemia, hypertension, and tremors, who presents for follow-up consultation of his obstructive sleep apnea, on treatment with CPAP. The patient is unaccompanied today. I first met him at the request of Dr. Krista Blue on 05/13/2014, at which time we went over his sleep test results and his CPAP compliance data. He was compliant with treatment and reported improved sleep as well as improved nocturia. I ordered an overnight pulse oximetry test to make sure his oxygen saturations were adequate with CPAP therapy.  Today, 11/10/2014: I reviewed his CPAP compliance data from 10/09/2014 through 11/07/2014 which is a total of 30 days during which time he used his CPAP every night with percent used days greater than 4 hours at 100%, indicating superb compliance with an average usage of 7 hours and 20 minutes and residual AHI low at 1.5 per hour and leak acceptable with the 95th percentile at 15.7 L/m on a pressure of 11 cm with EPR of 3.  Today, 11/10/2014: He reports doing well with CPAP. He indicates ongoing good results. He indicates full compliance. He is on propranolol for tremors and is doing well in that regard. He does have some left foot pain and occasional aching in his left wrist. He does have a history of gout but this pain is different. He is trying to lose weight. He did have a pulse oximetry test. I'm awaiting results to be faxed to me today. I will make an addendum once I have a chance to review it.  Previously:   He had a split night sleep study on 01/13/2014 and went over his test results with him in detail today. Baseline sleep efficiency was 81.4% with a latency to sleep of 16.5 minutes and wake after sleep onset of 13.5 minutes with mild sleep fragmentation noted. He had an increased  percentage of stage II sleep, absence of slow-wave sleep, and 11.1% of REM sleep with a mildly reduced REM latency. He had infrequent PVCs on EKG. He had no significant periodic leg movements of sleep. He had moderate to loud snoring. He slept mostly on his sides. His total AHI was high at 77.9 per hour, with a baseline oxygen saturation of only 89%, and a nadir of 68%. He was therefore titrated on CPAP. His sleep efficiency was high at 96.8%. He had an increased percentage of stage II sleep, absence of slow-wave sleep, and an increased percentage of REM sleep at 31%. Average oxygen saturation was 88% with a nadir of 82%. Time below 88% saturation was 2 minutes and 44 seconds. CPAP was titrated from 5-12 cm and his AHI was 4.2 per hour on a pressure of 11 cm. Based on the test results I started the patient on CPAP. I felt he may need an overnight pulse oximetry test once he is established on CPAP therapy to make sure his nocturnal hypoxemia has improved.   I reviewed the patient's CPAP compliance data from 02/12/2014 to 03/13/2014, which is a total of 30 days, during which time the patient used CPAP every day. The average usage for all days was 7 hours and 10 minutes. The percent used days greater than 4 hours was 100 %, indicating superb compliance. The residual AHI was 4.6 per hour, indicating an adequate treatment pressure of 11 cwp with EPR of 3. Pharmacist, community  leak from the mask was acceptable at 13.9 L per minute at the 95th percentile. I reviewed the patient's CPAP compliance data from 03/15/2014 to 04/13/2014, which is a total of 30 days, during which time the patient used CPAP every day. The average usage for all days was 7 hours and 23 minutes. The percent used days greater than 4 hours was 97%, indicating excellent compliance. The residual AHI was 2.3 per hour, indicating an appropriate treatment pressure of 11 cwp with EPR of 3. Air leak from the mask was acceptable at 16.2 L per minute at the 95th  percentile.  I reviewed his compliance data from 04/12/2014 through 05/11/2014 which is a total of 30 days during which time he used his machine 29 days. Percent used days greater than 4 hours was 93%, average usage of 7 hours and 30 minutes, residual AHI at 2.3 per hour leak acceptable at 20.7 for the 95th percentile. Pressure still at 11 cm with EPR of 3.  He goes to bed around 10. He watches TV in bed. His 70-monthold grandchild is with them in bed as they take care of him overnight since his daughter works nights. TV stays on all night. He gets up between 3 and 6 AM. This is primarily by habit. He feels better rested. His Epworth sleepiness score today is 2. He feels he has done well with CPAP. He did skip a night recently on a Sunday as he went fishing. He did have some nasal soreness with a large nasal pillows so currently he is on a medium. He has noted a little bit more nasal drainage since starting CPAP.  He drinks 1-2 cups of coffee per day. He quit smoking in '86.   His Past Medical History Is Significant For: Past Medical History  Diagnosis Date  . Diabetes   . High blood pressure   . Hyperlipemia   . Gout   . Tremor   . Obesity     His Past Surgical History Is Significant For: Past Surgical History  Procedure Laterality Date  . Hernia repair      His Family History Is Significant For: Family History  Problem Relation Age of Onset  . Breast cancer Mother   . High blood pressure Father   . Asthma Brother   . Glaucoma Brother   . Heart disease Sister     His Social History Is Significant For: History   Social History  . Marital Status: Married    Spouse Name: N/A  . Number of Children: 5  . Years of Education: Trade sch.   Occupational History  . Retired/ [employer]      EConsulting civil engineer  Social History Main Topics  . Smoking status: Former SResearch scientist (life sciences) . Smokeless tobacco: Never Used     Comment: Quit in 2014  . Alcohol Use: No  . Drug Use: No  . Sexual  Activity: Not on file   Other Topics Concern  . None   Social History Narrative   Patient is married and lives at home with his wife (Blanch Media   Patient is retired and works part time self employed. (Electerial work)   EScientist, physiologicalfour years of [employer]   Both handed.   Caffeine 1-2 cups per week.          His Allergies Are:  No Known Allergies:   His Current Medications Are:  Outpatient Encounter Prescriptions as of 11/10/2014  Medication Sig  . albuterol (PROVENTIL HFA;VENTOLIN HFA) 108 (90  BASE) MCG/ACT inhaler Inhale into the lungs every 6 (six) hours as needed for wheezing or shortness of breath.  . allopurinol (ZYLOPRIM) 100 MG tablet Take 100 mg by mouth 2 (two) times daily.   Marland Kitchen amLODipine-atorvastatin (CADUET) 10-40 MG per tablet Take 1 tablet by mouth daily.  Marland Kitchen aspirin 81 MG tablet Take 81 mg by mouth daily.  Marland Kitchen doxazosin (CARDURA) 8 MG tablet Take 8 mg by mouth daily.  Marland Kitchen glipiZIDE (GLUCOTROL) 10 MG tablet Take 10 mg by mouth daily before breakfast.  . hydrochlorothiazide (HYDRODIURIL) 25 MG tablet Take 25 mg by mouth daily.  Marland Kitchen lisinopril (PRINIVIL,ZESTRIL) 40 MG tablet Take 40 mg by mouth daily.  . metFORMIN (GLUCOPHAGE) 1000 MG tablet   . metFORMIN (GLUMETZA) 1000 MG (MOD) 24 hr tablet Take 1,000 mg by mouth 2 (two) times daily.   . pioglitazone (ACTOS) 45 MG tablet Take 45 mg by mouth daily.  Marland Kitchen POTASSIUM CITRATE PO Take 10 mg by mouth daily.   . propranolol (INDERAL) 80 MG tablet Take 80 mg by mouth 2 (two) times daily.  :  Review of Systems:  Out of a complete 14 point review of systems, all are reviewed and negative with the exception of these symptoms as listed below:   Review of Systems  Neurological: Positive for tremors and numbness.       C- Pap is working good. Patient states he is sleeping  well.     Objective:  Neurologic Exam  Physical Exam Physical Examination:   Filed Vitals:   11/10/14 0809  BP: 132/67  Pulse: 52    General  Examination: The patient is a very pleasant 64 y.o. male in no acute distress. He appears well-developed and well-nourished and well groomed.   HEENT: Normocephalic, atraumatic, pupils are equal, round and reactive to light and accommodation. Funduscopic exam is normal with sharp disc margins noted. Extraocular tracking is good without limitation to gaze excursion or nystagmus noted. Normal smooth pursuit is noted. Hearing is grossly intact. Tympanic membranes are clear bilaterally. Face is symmetric with normal facial animation and normal facial sensation. Speech is clear with no dysarthria noted. There is no hypophonia. There is no lip, neck/head, jaw or voice tremor. Neck is supple with full range of passive and active motion. There are no carotid bruits on auscultation. Oropharynx exam reveals: moderate mouth dryness, adequate dental hygiene and marked airway crowding, due to large tongue and redundant soft palate and wider uvula. Tonsils are not visualized. Mallampati is class III. Tongue protrudes centrally and palate elevates symmetrically. He has a very mild overbite. Nasal inspection reveals no significant nasal mucosal bogginess or redness and no septal deviation.   Chest: Clear to auscultation without wheezing, rhonchi or crackles noted.  Heart: S1+S2+0, regular and normal without murmurs, rubs or gallops noted.   Abdomen: Soft, non-tender and non-distended with normal bowel sounds appreciated on auscultation.  Extremities: There is trace pitting edema in the distal lower extremities bilaterally, right more than left. Pedal pulses are intact.  Skin: Warm and dry without trophic changes noted. There are no varicose veins.  Musculoskeletal: exam reveals no obvious joint deformities, tenderness or joint swelling or erythema.   Neurologically:  Mental status: The patient is awake, alert and oriented in all 4 spheres. His immediate and remote memory, attention, language skills and fund of  knowledge are appropriate. There is no evidence of aphasia, agnosia, apraxia or anomia. Speech is clear with normal prosody and enunciation. Thought process is linear. Mood is  normal and affect is normal.  Cranial nerves II - XII are as described above under HEENT exam. In addition: shoulder shrug is normal with equal shoulder height noted. Motor exam: Normal bulk, strength and tone is noted. There is no drift, tremor or rebound. No postural or action tremor today, better. Romberg is negative. Reflexes are 1+ in the UEs and diminished in the LEs. Fine motor skills and coordination: intact with normal finger taps, normal hand movements, normal rapid alternating patting, normal foot taps and normal foot agility.  Cerebellar testing: No dysmetria or intention tremor on finger to nose testing. Heel to shin is unremarkable bilaterally. There is no truncal or gait ataxia.  Sensory exam: intact to light touch.  Gait, station and balance: He stands easily. No veering to one side is noted. No leaning to one side is noted. Posture is age-appropriate and stance is narrow based. Gait shows normal stride length and normal pace. No problems turning are noted. He turns en bloc. Tandem walk is unremarkable.  Assessment and Plan:   In summary, Zimere Dunlevy is a very pleasant 64 year old male with an underlying medical history of obesity, diabetes, gout, hyperlipidemia, hypertension, as well as tremors, who was diagnosed with severe obstructive sleep apnea with significant nocturnal hypoxemia in July 2015. He has been on CPAP therapy since August 2015 with great results and wonderful compliance. He is congratulated on his treatment adherence and encouraged to continue to try to lose weight. I will review his overnight pulse oximetry test results and make sure that while he is on CPAP therapy he has adequate oxygen saturation at night. His physical exam is stable. He is encouraged to continue with therapy at the current  settings unless there is a reason to change it and I can see him back on a yearly basis at this point. If there is any updates or changes necessary depending on the oxygen report we will let him know. I answered all his questions today and he was in agreement. He has an appointment with our nurse practitioner, Jinny Blossom coming up in August to check on his tremors.  I spent 15 minutes in total face-to-face time with the patient, more than 50% of which was spent in counseling and coordination of care, reviewing test results, reviewing medication and discussing or reviewing the diagnosis of OSA, its prognosis and treatment options.  11/10/2014: I received his overnight pulse oximetry test results from his DME company, [facility]. This was from 05/25/2014, valid test and was 7 hours and 2 minutes, average oxygen saturation of only 89.75% on CPAP therapy. Lowest oxygen saturation was 76%, time below 88% saturation was 2 hours and 30 minutes. This indicates inadequate oxygen saturations despite appropriate CPAP therapy. This also indicates significant nocturnal hypoxemia that needs to be treated further. I would recommend that we start him on supplemental oxygen along with CPAP therapy at 2 L/m bleed and with his CPAP overnight each night. I would like to repeat his overnight pulse oximetry test approximately 3 days after he has been set up with oxygen. I will order oxygen through his current DME company. I will place the order today and we will also notify the patient.

## 2014-11-10 NOTE — Patient Instructions (Signed)
Keep up the good work with your CPAP! I will see you back once a year and I will look out for your oximetry report.

## 2014-11-10 NOTE — Addendum Note (Signed)
Addended by: Huston FoleyATHAR, Cozy Veale on: 11/10/2014 09:12 AM   Modules accepted: Orders

## 2014-11-10 NOTE — Telephone Encounter (Signed)
I was informed by his DME company that his last pulse oximetry test result while done on CPAP was too old and needs to be within 30 days. I will repeat an overnight pulse oximetry test while he is on CPAP therapy to see if he qualifies for supplemental oxygen therapy.

## 2014-11-10 NOTE — Progress Notes (Deleted)
Subjective:    Patient ID: Evan Willis is a 64 y.o. male.  HPI {Common ambulatory SmartLinks:19316}  Review of Systems  Objective:  Neurologic Exam  Physical Exam  Assessment:   ***  Plan:   ***

## 2014-11-10 NOTE — Telephone Encounter (Signed)
Daria from [facility] called wanting to speak with Lafonda Mossesiana regarding pt's oxygen. Please call and advice.  Merleen MillinerDaria can be reached @ [phone]

## 2014-11-10 NOTE — Telephone Encounter (Signed)
Please call patient, as discussed on 11/10/2014 during our office visit. I reviewed his oxygen saturation report that was done on 05/25/2014. It appears that he is not having appropriate oxygen saturations despite appropriate CPAP treatment. He did go as low as 76% desaturation. This happened very few times into the 70s but overall he spends too much time below the saturation of 88%, and therefore qualifies and should benefit from supplemental oxygen. I would like to get this ordered through his current DME company and I placed an order. They should be in touch with him. Please also move up his follow-up appointment 3-4 months from now.

## 2014-11-11 ENCOUNTER — Telehealth: Payer: Self-pay

## 2014-11-11 NOTE — Telephone Encounter (Signed)
He has an appt on 8/2 with Evan MilletMegan, do you want him to just keep that appt and I can cancel the one in May?

## 2014-11-11 NOTE — Telephone Encounter (Signed)
Please see other task on patient, this is duplicate.

## 2014-11-11 NOTE — Telephone Encounter (Signed)
[facility] and [facility] needed [facility] ONO order which I faxed to them just this morning. She stated that she will keep an eye out for it.

## 2014-11-11 NOTE — Telephone Encounter (Signed)
I spoke to Evan Willis and advised him that Dr. Frances FurbishAthar would like to see him back in 3-4 months. He asked if he can called back later because he needs to look at his schedule. Pt stated that he will call back for this appt and make one with the phone room.

## 2014-12-19 ENCOUNTER — Encounter: Payer: Self-pay | Admitting: Neurology

## 2015-02-14 ENCOUNTER — Encounter: Payer: Self-pay | Admitting: Adult Health

## 2015-02-14 ENCOUNTER — Ambulatory Visit (INDEPENDENT_AMBULATORY_CARE_PROVIDER_SITE_OTHER): Payer: Medicare Other | Admitting: Adult Health

## 2015-02-14 VITALS — BP 110/73 | HR 54 | Ht 71.0 in | Wt 278.0 lb

## 2015-02-14 DIAGNOSIS — G4733 Obstructive sleep apnea (adult) (pediatric): Secondary | ICD-10-CM | POA: Diagnosis not present

## 2015-02-14 DIAGNOSIS — Z9989 Dependence on other enabling machines and devices: Principal | ICD-10-CM

## 2015-02-14 NOTE — Progress Notes (Addendum)
PATIENT: Evan Willis DOB: Sep 24, 1950  REASON FOR VISIT: follow up- OSA on CPAP HISTORY FROM: patient  HISTORY OF PRESENT ILLNESS: Evan Willis is a 64 year old male with a history of obstructive sleep apnea on CPAP. He returns today for an evaluation. His CPAP download indicates that he uses machine 29 out of 30 days for a compliance of 97%. On average he uses his machine 7 hours and 53 minutes. His residual AHI is 2.2 at 11 cm of water with EPR of 3. The patient's leak in the 95th percentile is 14.9L/min. the patient states that he has been using oxygen supplementation at night. He states that he notices that is easier for him to fall asleep when he uses the oxygen. Patient states that he goes to bed between 9 and 11 PM and arises between 5 and 6 AM. He states that he gets up approximately 2-3 times at night to urinate. His Epworth sleepiness score is 3 and fatigue severity score is 24. The patient states that if he lies on his back he will experience numbness in the left hand and leg but if he lays on his side the symptoms resolved. He states that this has been ongoing. He returns today for an evaluation.  HISTORY 11/10/14 (SA): Evan Willis is a 64 year old right-handed gentleman with an underlying medical history of obesity, diabetes, gout, hyperlipidemia, hypertension, and tremors, who presents for follow-up consultation of his obstructive sleep apnea, on treatment with CPAP. The patient is unaccompanied today. I first met him at the request of Dr. Krista Blue on 05/13/2014, at which time we went over his sleep test results and his CPAP compliance data. He was compliant with treatment and reported improved sleep as well as improved nocturia. I ordered an overnight pulse oximetry test to make sure his oxygen saturations were adequate with CPAP therapy.  Today, 11/10/2014: I reviewed his CPAP compliance data from 10/09/2014 through 11/07/2014 which is a total of 30 days during which time he used his CPAP  every night with percent used days greater than 4 hours at 100%, indicating superb compliance with an average usage of 7 hours and 20 minutes and residual AHI low at 1.5 per hour and leak acceptable with the 95th percentile at 15.7 L/m on a pressure of 11 cm with EPR of 3.  Today, 11/10/2014: He reports doing well with CPAP. He indicates ongoing good results. He indicates full compliance. He is on propranolol for tremors and is doing well in that regard. He does have some left foot pain and occasional aching in his left wrist. He does have a history of gout but this pain is different. He is trying to lose weight. He did have a pulse oximetry test. I'm awaiting results to be faxed to me today. I will make an addendum once I have a chance to review it.  REVIEW OF SYSTEMS: Out of a complete 14 system review of symptoms, the patient complains only of the following symptoms, and all other reviewed systems are negative.  Apnea, numbness, tremors  ALLERGIES: No Known Allergies  HOME MEDICATIONS: Outpatient Prescriptions Prior to Visit  Medication Sig Dispense Refill  . albuterol (PROVENTIL HFA;VENTOLIN HFA) 108 (90 BASE) MCG/ACT inhaler Inhale into the lungs every 6 (six) hours as needed for wheezing or shortness of breath.    . allopurinol (ZYLOPRIM) 100 MG tablet Take 100 mg by mouth 2 (two) times daily.     Marland Kitchen amLODipine-atorvastatin (CADUET) 10-40 MG per tablet Take 1 tablet by mouth  daily.    . aspirin 81 MG tablet Take 81 mg by mouth daily.    Marland Kitchen doxazosin (CARDURA) 8 MG tablet Take 8 mg by mouth daily.    Marland Kitchen glipiZIDE (GLUCOTROL) 10 MG tablet Take 10 mg by mouth daily before breakfast.    . hydrochlorothiazide (HYDRODIURIL) 25 MG tablet Take 25 mg by mouth daily.    Marland Kitchen lisinopril (PRINIVIL,ZESTRIL) 40 MG tablet Take 40 mg by mouth daily.    . metFORMIN (GLUCOPHAGE) 1000 MG tablet   11  . metFORMIN (GLUMETZA) 1000 MG (MOD) 24 hr tablet Take 1,000 mg by mouth 2 (two) times daily.     . pioglitazone  (ACTOS) 45 MG tablet Take 45 mg by mouth daily.    Marland Kitchen POTASSIUM CITRATE PO Take 10 mg by mouth daily.     . propranolol (INDERAL) 80 MG tablet Take 80 mg by mouth 2 (two) times daily.     No facility-administered medications prior to visit.    PAST MEDICAL HISTORY: Past Medical History  Diagnosis Date  . Diabetes   . High blood pressure   . Hyperlipemia   . Gout   . Tremor   . Obesity     PAST SURGICAL HISTORY: Past Surgical History  Procedure Laterality Date  . Hernia repair      FAMILY HISTORY: Family History  Problem Relation Age of Onset  . Breast cancer Mother   . High blood pressure Father   . Asthma Brother   . Glaucoma Brother   . Heart disease Sister     SOCIAL HISTORY: History   Social History  . Marital Status: Married    Spouse Name: N/A  . Number of Children: 5  . Years of Education: Trade sch.   Occupational History  . 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 Smoking status: Former Research scientist (life sciences)  . Smokeless tobacco: Never Used     Comment: Quit in 1986  . Alcohol Use: No  . Drug Use: No  . Sexual Activity: Not on file   Other Topics Concern  . Not on file   Social History Narrative   Patient is married and lives at home with his wife Blanch Media)   Patient is retired and works part time self employed. 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   Both handed.   Caffeine 1-2 cups per week.            PHYSICAL EXAM  Filed Vitals:   02/14/15 0824  BP: 110/73  Pulse: 54  Height: _0  (1.803 m)  Weight: 278 lb (126.1 kg)   Body mass index is 38.79 kg/(m^2).  Generalized: Well developed, in no acute distress  Neck: Mallampati 3  Neurological examination  Mentation: Alert oriented to time, place, history taking. Follows all commands speech and language fluent Cranial nerve II-XII: Pupils were equal round reactive to light. Extraocular movements were full, visual field were full on  confrontational test. Facial sensation and strength were normal. Uvula tongue midline. Head turning and shoulder shrug  were normal and symmetric. Motor: The motor testing reveals 5 over 5 strength of all 4 extremities. Good symmetric motor tone is noted throughout.  Sensory: Sensory testing is intact to soft touch on all 4 extremities. No evidence of extinction is noted.  Coordination: Cerebellar testing reveals good finger-nose-finger and heel-to-shin bilaterally.  Gait and station: Gait is normal. Tandem gait is normal. Romberg is negative. No  drift is seen.  Reflexes: Deep tendon reflexes are symmetric and normal bilaterally.   DIAGNOSTIC DATA (LABS, IMAGING, TESTING) - I reviewed patient records, labs, notes, testing and imaging myself where available.       ASSESSMENT AND PLAN 64 y.o. year old male  has a past medical history of Diabetes; High blood pressure; Hyperlipemia; Gout; Tremor; and Obesity. here with:  1. Obstructive sleep apnea on CPAP  The patient's CPAP download shows excellent compliance. He should continue using the CPAP and oxygen supplementation nightly. Patient advised that if his symptoms worsen or he develops new symptoms he should let us know. Otherwise he he will keep his appointment in May 2017 with Dr. Rexene Alberts.     Ward Givens, MSN, NP-C 02/14/2015, 8:29 AM [facility] [address] [place], [place] [address] [phone]  Note: This document was prepared with digital dictation and possible smart phrase technology. Any transcriptional errors that result from this process are unintentional.  I reviewed the above note and documentation by the Nurse Practitioner and agree with the history, physical exam, assessment and plan as outlined above. I was immediately available for face-to-face consultation. Star Age, MD, PhD [facility])

## 2015-02-14 NOTE — Patient Instructions (Signed)
Continue CPAP and oxygen therapy at night If your symptoms worsen or you develop new symptoms please let us know.

## 2015-05-26 ENCOUNTER — Other Ambulatory Visit: Payer: Self-pay | Admitting: Family Medicine

## 2015-05-26 DIAGNOSIS — Z136 Encounter for screening for cardiovascular disorders: Secondary | ICD-10-CM

## 2015-07-18 ENCOUNTER — Ambulatory Visit
Admission: RE | Admit: 2015-07-18 | Discharge: 2015-07-18 | Disposition: A | Discharge status: Home / Self Care | Payer: Medicare Other | Source: Ambulatory Visit | Attending: Family Medicine | Admitting: Family Medicine

## 2015-07-18 DIAGNOSIS — Z136 Encounter for screening for cardiovascular disorders: Secondary | ICD-10-CM

## 2015-11-13 ENCOUNTER — Ambulatory Visit (INDEPENDENT_AMBULATORY_CARE_PROVIDER_SITE_OTHER): Payer: Medicare Other | Admitting: Neurology

## 2015-11-13 ENCOUNTER — Encounter: Payer: Self-pay | Admitting: Neurology

## 2015-11-13 VITALS — BP 130/78 | HR 70 | Resp 18 | Ht 71.0 in | Wt 279.0 lb

## 2015-11-13 DIAGNOSIS — Z9989 Dependence on other enabling machines and devices: Principal | ICD-10-CM

## 2015-11-13 DIAGNOSIS — G4733 Obstructive sleep apnea (adult) (pediatric): Secondary | ICD-10-CM | POA: Diagnosis not present

## 2015-11-13 DIAGNOSIS — G4734 Idiopathic sleep related nonobstructive alveolar hypoventilation: Secondary | ICD-10-CM

## 2015-11-13 NOTE — Progress Notes (Signed)
Subjective:    Patient ID: Evan Willis is a 65 y.o. male.  HPI     Interim history:   Mr. Evan Willis is a 65 year old right-handed gentleman with an underlying medical history of obesity, diabetes, gout, hyperlipidemia, hypertension, and tremors, who presents for follow-up consultation of his obstructive sleep apnea, on treatment with CPAP. The patient is unaccompanied today. I last saw him on 11/10/2014, at which time he was doing well with CPAP. He indicated good results and he was fully compliant with treatment. He had some left foot pain and has a history of gout. He was trying to lose weight. We did an overnight pulse oximetry test on 11/16/2014 which showed an average oxygen saturation of 91%, nadir was 74%, time below 88% saturation was about 10 minutes. He was in the interim seen by ourpractitioner in August 2016. She was doing well on CPAP.   Today, 11/13/2015: I reviewed his CPAP compliance data from 10/10/2015 through 11/08/2015 which is a total of 30 days during which time he used his machine every night with percent used days greater than 4 hours at 90%, indicating excellent compliance with an average usage of 6 hours and 10 minutes, residual AHI 1.7 per hour, leaked low with the 95th percentile at 8.7 L/m on a pressure of 11 cm with EPR of 3.  Today, 11/13/2015: He reports doing well, weight stable, no new concerns, intermittent trembling on the L side, unchanged, needs O2 recertified and supplies ordered. Trying to lose weight. Blood sugar stable, A1c in the 6 point something range, as he recalls.   Previously:   I first met him at the request of Dr. Krista Blue on 05/13/2014, at which time we went over his sleep test results and his CPAP compliance data. He was compliant with treatment and reported improved sleep as well as improved nocturia. I ordered an overnight pulse oximetry test to make sure his oxygen saturations were adequate with CPAP therapy.  I reviewed his CPAP compliance data  from 10/09/2014 through 11/07/2014 which is a total of 30 days during which time he used his CPAP every night with percent used days greater than 4 hours at 100%, indicating superb compliance with an average usage of 7 hours and 20 minutes and residual AHI low at 1.5 per hour and leak acceptable with the 95th percentile at 15.7 L/m on a pressure of 11 cm with EPR of 3.  He had a split night sleep study on 01/13/2014 and went over his test results with him in detail today. Baseline sleep efficiency was 81.4% with a latency to sleep of 16.5 minutes and wake after sleep onset of 13.5 minutes with mild sleep fragmentation noted. He had an increased percentage of stage II sleep, absence of slow-wave sleep, and 11.1% of REM sleep with a mildly reduced REM latency. He had infrequent PVCs on EKG. He had no significant periodic leg movements of sleep. He had moderate to loud snoring. He slept mostly on his sides. His total AHI was high at 77.9 per hour, with a baseline oxygen saturation of only 89%, and a nadir of 68%. He was therefore titrated on CPAP. His sleep efficiency was high at 96.8%. He had an increased percentage of stage II sleep, absence of slow-wave sleep, and an increased percentage of REM sleep at 31%. Average oxygen saturation was 88% with a nadir of 82%. Time below 88% saturation was 2 minutes and 44 seconds. CPAP was titrated from 5-12 cm and his AHI was 4.2 per  hour on a pressure of 11 cm. Based on the test results I started the patient on CPAP. I felt he may need an overnight pulse oximetry test once he is established on CPAP therapy to make sure his nocturnal hypoxemia has improved.   I reviewed the patient's CPAP compliance data from 02/12/2014 to 03/13/2014, which is a total of 30 days, during which time the patient used CPAP every day. The average usage for all days was 7 hours and 10 minutes. The percent used days greater than 4 hours was 100 %, indicating superb compliance. The residual AHI was  4.6 per hour, indicating an adequate treatment pressure of 11 cwp with EPR of 3. Air leak from the mask was acceptable at 13.9 L per minute at the 95th percentile. I reviewed the patient's CPAP compliance data from 03/15/2014 to 04/13/2014, which is a total of 30 days, during which time the patient used CPAP every day. The average usage for all days was 7 hours and 23 minutes. The percent used days greater than 4 hours was 97%, indicating excellent compliance. The residual AHI was 2.3 per hour, indicating an appropriate treatment pressure of 11 cwp with EPR of 3. Air leak from the mask was acceptable at 16.2 L per minute at the 95th percentile.  I reviewed his compliance data from 04/12/2014 through 05/11/2014 which is a total of 30 days during which time he used his machine 29 days. Percent used days greater than 4 hours was 93%, average usage of 7 hours and 30 minutes, residual AHI at 2.3 per hour leak acceptable at 20.7 for the 95th percentile. Pressure still at 11 cm with EPR of 3.  He goes to bed around 10. He watches TV in bed. His 72-monthold grandchild is with them in bed as they take care of him overnight since his daughter works nights. TV stays on all night. He gets up between 3 and 6 AM. This is primarily by habit. He feels better rested. His Epworth sleepiness score today is 2. He feels he has done well with CPAP. He did skip a night recently on a Sunday as he went fishing. He did have some nasal soreness with a large nasal pillows so currently he is on a medium. He has noted a little bit more nasal drainage since starting CPAP.  He drinks 1-2 cups of coffee per day. He quit smoking in '86.   His Past Medical History Is Significant For: Past Medical History  Diagnosis Date  . Diabetes ([place] Sun   . High blood pressure   . Hyperlipemia   . Gout   . Tremor   . Obesity     His Past Surgical History Is Significant For: Past Surgical History  Procedure Laterality Date  . Hernia repair       His Family History Is Significant For: Family History  Problem Relation Age of Onset  . Breast cancer Mother   . High blood pressure Father   . Asthma Brother   . Glaucoma Brother   . Heart disease Sister     His Social History Is Significant For: Social History   Social History  . Marital Status: Married    Spouse Name: N/A  . Number of Children: 5  . Years of Education: Trade sch.   Occupational History  . Retired/ [employer]      EConsulting civil engineer  Social History Main Topics  . Smoking status: Former SResearch scientist (life sciences) . Smokeless tobacco: Never Used  Comment: Quit in 1986  . Alcohol Use: No  . Drug Use: No  . Sexual Activity: Not Asked   Other Topics Concern  . None   Social History Narrative   Patient is married and lives at home with his wife Blanch Media)   Patient is retired and works part time self employed. 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   Both handed.   Caffeine 1-2 cups per week.          His Allergies Are:  No Known Allergies:   His Current Medications Are:  Outpatient Encounter Prescriptions as of 11/13/2015  Medication Sig  . albuterol (PROVENTIL HFA;VENTOLIN HFA) 108 (90 BASE) MCG/ACT inhaler Inhale into the lungs every 6 (six) hours as needed for wheezing or shortness of breath.  . allopurinol (ZYLOPRIM) 100 MG tablet Take 100 mg by mouth 2 (two) times daily.   Marland Kitchen amLODipine-atorvastatin (CADUET) 10-40 MG per tablet Take 1 tablet by mouth daily.  Marland Kitchen aspirin 81 MG tablet Take 81 mg by mouth daily.  Marland Kitchen doxazosin (CARDURA) 8 MG tablet Take 8 mg by mouth daily.  Marland Kitchen glipiZIDE (GLUCOTROL) 10 MG tablet Take 10 mg by mouth daily before breakfast.  . hydrochlorothiazide (HYDRODIURIL) 25 MG tablet Take 25 mg by mouth daily.  Marland Kitchen lisinopril (PRINIVIL,ZESTRIL) 40 MG tablet Take 40 mg by mouth daily.  . metFORMIN (GLUCOPHAGE) 1000 MG tablet   . metFORMIN (GLUMETZA) 1000 MG (MOD) 24 hr tablet Take 1,000 mg by mouth 2 (two) times daily.   .  pioglitazone (ACTOS) 45 MG tablet Take 45 mg by mouth daily.  . potassium chloride (MICRO-K) 10 MEQ CR capsule TK 1 C  PO QD  . propranolol (INDERAL) 80 MG tablet Take 80 mg by mouth 2 (two) times daily.  . [DISCONTINUED] POTASSIUM CITRATE PO Take 10 mg by mouth daily.    No facility-administered encounter medications on file as of 11/13/2015.  :  Review of Systems:  Out of a complete 14 point review of systems, all are reviewed and negative with the exception of these symptoms as listed below:   Review of Systems  Neurological:       Patient states that he is doing well with CPAP. Needs new mask. No new concerns.     Objective:  Neurologic Exam  Physical Exam Physical Examination:   Filed Vitals:   11/13/15 0815  BP: 130/78  Pulse: 70  Resp: 18    General Examination: The patient is a very pleasant 65 y.o. male in no acute distress. He appears well-developed and well-nourished and well groomed.   HEENT: Normocephalic, atraumatic, pupils are equal, round and reactive to light and accommodation. Mild cataract on the R. Extraocular tracking is good without limitation to gaze excursion or nystagmus noted. Normal smooth pursuit is noted. Hearing is grossly intact. Face is symmetric with normal facial animation and normal facial sensation. Speech is clear with no dysarthria noted. There is no hypophonia. There is no lip, neck/head, jaw or voice tremor. Neck is supple with full range of passive and active motion. There are no carotid bruits on auscultation. Oropharynx exam reveals: moderate mouth dryness, adequate dental hygiene and marked airway crowding, due to large tongue and redundant soft palate and wider uvula. Tonsils are not visualized. Mallampati is class III. Tongue protrudes centrally and palate elevates symmetrically. He has a very mild overbite. Nasal inspection reveals no significant nasal mucosal bogginess or redness and no septal deviation.   Chest: Clear to auscultation  without wheezing, rhonchi or crackles noted.  Heart: S1+S2+0, regular and normal without murmurs, rubs or gallops noted.   Abdomen: Soft, non-tender and non-distended with normal bowel sounds appreciated on auscultation.  Extremities: There is trace pitting edema in the distal lower extremities bilaterally, right more than left. Pedal pulses are intact.  Skin: Warm and dry without trophic changes noted. There are no varicose veins.  Musculoskeletal: exam reveals no obvious joint deformities, tenderness or joint swelling or erythema.   Neurologically:  Mental status: The patient is awake, alert and oriented in all 4 spheres. His immediate and remote memory, attention, language skills and fund of knowledge are appropriate. There is no evidence of aphasia, agnosia, apraxia or anomia. Speech is clear with normal prosody and enunciation. Thought process is linear. Mood is normal and affect is normal.  Cranial nerves II - XII are as described above under HEENT exam. In addition: shoulder shrug is normal with equal shoulder height noted. Motor exam: Normal bulk, strength and tone is noted. There is no drift, tremor or rebound. No postural or action tremor. Romberg is negative. Reflexes are 1+ in the UEs and diminished in the LEs. Fine motor skills and coordination: intact with normal finger taps, normal hand movements, normal rapid alternating patting, normal foot taps and normal foot agility.  Cerebellar testing: No dysmetria or intention tremor on finger to nose testing. Heel to shin is unremarkable bilaterally. There is no truncal or gait ataxia.  Sensory exam: intact to light touch in the UEs and LEs.  Gait, station and balance: He stands easily. No veering to one side is noted. No leaning to one side is noted. Posture is age-appropriate and stance is narrow based. Gait shows normal stride length and normal pace. No problems turning are noted. He turns en bloc. Tandem walk is  unremarkable.  Assessment and Plan:   In summary, Kyrell Ruacho is a very pleasant 65 year old male with an underlying medical history of obesity, diabetes, gout, hyperlipidemia, hypertension, as well as tremors, who was diagnosed with severe obstructive sleep apnea with significant nocturnal hypoxemia in July 2015. He has been on CPAP therapy since August 2015 with great results and excellent compliance. He is congratulated on his treatment adherence and encouraged to continue to try to lose weight. He has been on supplemental oxygen since his abnormal overnight pulse oximetry test results last year. His physical exam is stable. He is encouraged to continue with therapy at the current settings unless there is a reason to change it and I can see him back in one year. I renewed his CPAP supply order. I answered all his questions today and he was in agreement.  We reviewed his split night sleep study from 01/13/14, which showed severe OSA. We also reviewed his most recent compliance data together.

## 2015-11-13 NOTE — Patient Instructions (Signed)

## 2015-12-19 ENCOUNTER — Telehealth: Payer: Self-pay | Admitting: Neurology

## 2015-12-20 NOTE — Telephone Encounter (Signed)
Mailed records to patient dg

## 2016-11-12 ENCOUNTER — Encounter (INDEPENDENT_AMBULATORY_CARE_PROVIDER_SITE_OTHER): Payer: Self-pay

## 2016-11-12 ENCOUNTER — Ambulatory Visit (INDEPENDENT_AMBULATORY_CARE_PROVIDER_SITE_OTHER): Payer: Medicare Other | Admitting: Neurology

## 2016-11-12 ENCOUNTER — Encounter: Payer: Self-pay | Admitting: Neurology

## 2016-11-12 VITALS — BP 158/76 | HR 68 | Resp 16 | Ht 71.0 in | Wt 290.0 lb

## 2016-11-12 DIAGNOSIS — G4733 Obstructive sleep apnea (adult) (pediatric): Secondary | ICD-10-CM

## 2016-11-12 DIAGNOSIS — Z9989 Dependence on other enabling machines and devices: Secondary | ICD-10-CM | POA: Diagnosis not present

## 2016-11-12 NOTE — Patient Instructions (Signed)

## 2016-11-12 NOTE — Progress Notes (Signed)
Subjective:    Patient ID: Evan Willis is a 66 y.o. male.  HPI     Interim history:   Mr. Evan Willis is a 66 year old right-handed gentleman with an underlying medical history of obesity, diabetes, gout, hyperlipidemia, hypertension, and tremors, who presents for follow-up consultation of his obstructive sleep apnea, on treatment with CPAP. The patient is unaccompanied today. I last saw him on 11/13/2015, at which time he was compliant with CPAP therapy and felt he was doing well. His weight was stable. He had some intermittent trembling on the left side. He needs recertification for his oxygen. He was trying to lose weight, A1c was less than 7.   Today, 67/07/2016 (all dictated new, as well as above notes, some dictation done in note pad or Word, outside of chart, may appear as copied):  I reviewed his CPAP compliance data from 10/12/2016 through 11/10/2016, which is a total of 30 days, during which time he used his CPAP 29 days with percent used days greater than 4 hours at 93%, indicating excellent compliance with an average usage of 6 hours and 4 minutes, residual AHI 1.6 per hour, leak on the higher side with the 95th percentile at 23.3 L/m from a pressure of 11 cm with EPR of 3. He reports doing well with CPAP, no complaints. Weight gain, due to less activity likely. He is getting ready to the plant a garden for discharge. He will be more active at the time. He reports his latest A1c to be less than 7. Sometimes a CPAP pressure feels too high.  The patient's allergies, current medications, family history, past medical history, past social history, past surgical history and problem list were reviewed and updated as appropriate.   Previously (copied from previous notes for reference):   I saw him on 11/10/2014, at which time he was doing well with CPAP. He indicated good results and he was fully compliant with treatment. He had some left foot pain and has a history of gout. He was trying to lose  weight. We did an overnight pulse oximetry test on 11/16/2014 which showed an average oxygen saturation of 91%, nadir was 74%, time below 88% saturation was about 10 minutes. He was in the interim seen by ourpractitioner in August 2016, at which time he was doing well on CPAP.    I reviewed his CPAP compliance data from 10/10/2015 through 11/08/2015 which is a total of 30 days during which time he used his machine every night with percent used days greater than 4 hours at 90%, indicating excellent compliance with an average usage of 6 hours and 10 minutes, residual AHI 1.7 per hour, leaked low with the 95th percentile at 8.7 L/m on a pressure of 11 cm with EPR of 3.   I first met him at the request of Dr. Krista Blue on 05/13/2014, at which time we went over his sleep test results and his CPAP compliance data. He was compliant with treatment and reported improved sleep as well as improved nocturia. I ordered an overnight pulse oximetry test to make sure his oxygen saturations were adequate with CPAP therapy.   I reviewed his CPAP compliance data from 10/09/2014 through 11/07/2014 which is a total of 30 days during which time he used his CPAP every night with percent used days greater than 4 hours at 100%, indicating superb compliance with an average usage of 7 hours and 20 minutes and residual AHI low at 1.5 per hour and leak acceptable with the 95th percentile  at 15.7 L/m on a pressure of 11 cm with EPR of 3.   He had a split night sleep study on 01/13/2014 and went over his test results with him in detail today. Baseline sleep efficiency was 81.4% with a latency to sleep of 16.5 minutes and wake after sleep onset of 13.5 minutes with mild sleep fragmentation noted. He had an increased percentage of stage II sleep, absence of slow-wave sleep, and 11.1% of REM sleep with a mildly reduced REM latency. He had infrequent PVCs on EKG. He had no significant periodic leg movements of sleep. He had moderate to loud  snoring. He slept mostly on his sides. His total AHI was high at 77.9 per hour, with a baseline oxygen saturation of only 89%, and a nadir of 68%. He was therefore titrated on CPAP. His sleep efficiency was high at 96.8%. He had an increased percentage of stage II sleep, absence of slow-wave sleep, and an increased percentage of REM sleep at 31%. Average oxygen saturation was 88% with a nadir of 82%. Time below 88% saturation was 2 minutes and 44 seconds. CPAP was titrated from 5-12 cm and his AHI was 4.2 per hour on a pressure of 11 cm. Based on the test results I started the patient on CPAP. I felt he may need an overnight pulse oximetry test once he is established on CPAP therapy to make sure his nocturnal hypoxemia has improved.   I reviewed the patient's CPAP compliance data from 02/12/2014 to 03/13/2014, which is a total of 30 days, during which time the patient used CPAP every day. The average usage for all days was 7 hours and 10 minutes. The percent used days greater than 4 hours was 100 %, indicating superb compliance. The residual AHI was 4.6 per hour, indicating an adequate treatment pressure of 11 cwp with EPR of 3. Air leak from the mask was acceptable at 13.9 L per minute at the 95th percentile. I reviewed the patient's CPAP compliance data from 03/15/2014 to 04/13/2014, which is a total of 30 days, during which time the patient used CPAP every day. The average usage for all days was 7 hours and 23 minutes. The percent used days greater than 4 hours was 97%, indicating excellent compliance. The residual AHI was 2.3 per hour, indicating an appropriate treatment pressure of 11 cwp with EPR of 3. Air leak from the mask was acceptable at 16.2 L per minute at the 95th percentile.   I reviewed his compliance data from 04/12/2014 through 05/11/2014 which is a total of 30 days during which time he used his machine 29 days. Percent used days greater than 4 hours was 93%, average usage of 7 hours and 30  minutes, residual AHI at 2.3 per hour leak acceptable at 20.7 for the 95th percentile. Pressure still at 11 cm with EPR of 3.   He goes to bed around 10. He watches TV in bed. His 93-monthold grandchild is with them in bed as they take care of him overnight since his daughter works nights. TV stays on all night. He gets up between 3 and 6 AM. This is primarily by habit. He feels better rested. His Epworth sleepiness score today is 2. He feels he has done well with CPAP. He did skip a night recently on a Sunday as he went fishing. He did have some nasal soreness with a large nasal pillows so currently he is on a medium. He has noted a little bit more nasal drainage  since starting CPAP.  He drinks 1-2 cups of coffee per day. He quit smoking in '86.   His Past Medical History Is Significant For: Past Medical History:  Diagnosis Date  . Diabetes (HCC)   . Gout   . High blood pressure   . Hyperlipemia   . Obesity   . Tremor     His Past Surgical History Is Significant For: Past Surgical History:  Procedure Laterality Date  . HERNIA REPAIR      His Family History Is Significant For: Family History  Problem Relation Age of Onset  . Breast cancer Mother   . High blood pressure Father   . Asthma Brother   . Glaucoma Brother   . Heart disease Sister     His Social History Is Significant For: Social History   Social History  . Marital status: Married    Spouse name: N/A  . Number of children: 5  . Years of education: Trade sch.   Occupational History  . Retired/ [employer]      Government social research officer   Social History Main Topics  . Smoking status: Former Games developer  . Smokeless tobacco: Never Used     Comment: Quit in 1986  . Alcohol use No  . Drug use: No  . Sexual activity: Not Asked   Other Topics Concern  . None   Social History Narrative   Patient is married and lives at home with his wife Alona Bene)   Patient is retired and works part time self employed. 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   Both handed.   Caffeine 1-2 cups per week.          His Allergies Are:  No Known Allergies:   His Current Medications Are:  Outpatient Encounter Prescriptions as of 11/12/2016  Medication Sig  . allopurinol (ZYLOPRIM) 100 MG tablet Take 100 mg by mouth 2 (two) times daily.   Marland Kitchen amLODipine-atorvastatin (CADUET) 10-40 MG per tablet Take 1 tablet by mouth daily.  Marland Kitchen aspirin 81 MG tablet Take 81 mg by mouth daily.  Marland Kitchen doxazosin (CARDURA) 8 MG tablet Take 8 mg by mouth daily.  Marland Kitchen glipiZIDE (GLUCOTROL) 5 MG tablet Take 10 mg by mouth daily before breakfast.  . hydrochlorothiazide (HYDRODIURIL) 25 MG tablet Take 25 mg by mouth daily.  Marland Kitchen lisinopril (PRINIVIL,ZESTRIL) 40 MG tablet Take 40 mg by mouth daily.  . metFORMIN (GLUCOPHAGE) 1000 MG tablet   . metFORMIN (GLUMETZA) 1000 MG (MOD) 24 hr tablet Take 1,000 mg by mouth 2 (two) times daily.   . pioglitazone (ACTOS) 45 MG tablet Take 45 mg by mouth daily.  . potassium chloride (MICRO-K) 10 MEQ CR capsule TK 1 C  PO QD  . propranolol (INDERAL) 80 MG tablet Take 80 mg by mouth 2 (two) times daily.  . [DISCONTINUED] albuterol (PROVENTIL HFA;VENTOLIN HFA) 108 (90 BASE) MCG/ACT inhaler Inhale into the lungs every 6 (six) hours as needed for wheezing or shortness of breath.   No facility-administered encounter medications on file as of 11/12/2016.   :  Review of Systems:  Out of a complete 14 point review of systems, all are reviewed and negative with the exception of these symptoms as listed below: Review of Systems  Neurological:       Patient states that he is doing well with CPAP. No new concerns.     Objective:  Neurologic Exam  Physical Exam Physical Examination:   Vitals:   11/12/16 0817  BP: Marland Kitchen)  158/76  Pulse: 68  Resp: 16   General Examination: The patient is a very pleasant 66 y.o. male in no acute distress. He appears well-developed and well-nourished and well groomed.   HEENT:  Normocephalic, atraumatic, pupils are equal, round and reactive to light and accommodation. Extraocular tracking is good without limitation to gaze excursion or nystagmus noted. Cataracts b/l. Normal smooth pursuit is noted. Hearing is grossly intact. Face is symmetric with normal facial animation and normal facial sensation. Speech is clear with no dysarthria noted. There is no hypophonia. There is no lip, neck/head, jaw or voice tremor. Neck is supple with full range of passive and active motion. There are no carotid bruits on auscultation. Oropharynx exam reveals: mild mouth dryness, adequate dental hygiene and moderate to significant airway crowding. Mallampati is class III. Tongue protrudes centrally and palate elevates symmetrically.   Chest: Clear to auscultation without wheezing, rhonchi or crackles noted.  Heart: S1+S2+0, regular and normal without murmurs, rubs or gallops noted.   Abdomen: Soft, non-tender and non-distended with normal bowel sounds appreciated on auscultation.  Extremities: There is trace pitting edema in the distal lower extremities bilaterally.   Skin: Warm and dry without trophic changes noted.  Musculoskeletal: exam reveals no obvious joint deformities, tenderness or joint swelling or erythema.   Neurologically:  Mental status: The patient is awake, alert and oriented in all 4 spheres. His immediate and remote memory, attention, language skills and fund of knowledge are appropriate. There is no evidence of aphasia, agnosia, apraxia or anomia. Speech is clear with normal prosody and enunciation. Thought process is linear. Mood is normal and affect is normal.  Cranial nerves II - XII are as described above under HEENT exam. In addition: shoulder shrug is normal with equal shoulder height noted. Motor exam: Normal bulk, strength and tone is noted. There is no drift, resting tremor or rebound. Minimal b/l postural and action tremor both UEs, L more noticeable. Romberg is  negative. Reflexes are 1-2+ throughout. Fine motor skills and coordination: intact with normal finger taps, normal hand movements, normal rapid alternating patting, normal foot taps and normal foot agility.  Cerebellar testing: No dysmetria or intention tremor on finger to nose testing. Heel to shin is unremarkable bilaterally. There is no truncal or gait ataxia.  Sensory exam: intact to light touch in the upper and lower extremities.  Gait, station and balance: He stands easily. No veering to one side is noted. No leaning to one side is noted. Posture is age-appropriate and stance is narrow based. Gait shows normal stride length and normal pace. No problems turning are noted. Tandem walk is unremarkable.               Assessment and Plan:  In summary, Lavante Toso is a very pleasant 66 y.o.-year old male with an underlying medical history of obesity, diabetes, gout, hyperlipidemia, hypertension, as well as tremors, who was diagnosed with severe obstructive sleep apnea with significant nocturnal hypoxemia in July 2015. He had a split night sleep study on 01/13/14. He has  been on CPAP therapy since August 2015 with good results and excellent compliance. He is congratulated on his treatment adherence and encouraged to continue to try to lose weight. He has gained about 11 lb since last year. He has been on supplemental oxygen since his abnormal overnight pulse oximetry test results in 2016. His physical exam is stable, tremor slightly worse perhaps. He is encouraged to continue with therapy, But we will try to scale back  on the pressure to 10 cm as he reports that sometimes the pressure feels too high. We reviewed his most recent compliance data together. I explained the importance of being compliant with PAP treatment, not only for insurance purposes but to improve sleep-related symptoms and long term health benefit including reduction of cardiovascular risk. I suggested a one-year checkup, he can see one of  our nurse practitioners at the time. I placed an order for CPAP supplies and pressure reduction from 11 cm to 10 cm. I answered all his questions today and he was in agreement.

## 2017-09-04 ENCOUNTER — Other Ambulatory Visit: Payer: Self-pay | Admitting: Family Medicine

## 2017-09-04 ENCOUNTER — Ambulatory Visit
Admission: RE | Admit: 2017-09-04 | Discharge: 2017-09-04 | Disposition: A | Discharge status: Home / Self Care | Payer: Medicare Other | Source: Ambulatory Visit | Attending: Family Medicine | Admitting: Family Medicine

## 2017-09-04 DIAGNOSIS — M25561 Pain in right knee: Secondary | ICD-10-CM

## 2017-10-12 ENCOUNTER — Encounter: Payer: Self-pay | Admitting: Adult Health

## 2017-11-18 ENCOUNTER — Ambulatory Visit: Payer: Medicare Other | Admitting: Adult Health

## 2017-11-18 ENCOUNTER — Encounter: Payer: Self-pay | Admitting: Adult Health

## 2017-11-18 VITALS — BP 136/79 | HR 59 | Ht 72.0 in | Wt 290.0 lb

## 2017-11-18 DIAGNOSIS — G4733 Obstructive sleep apnea (adult) (pediatric): Secondary | ICD-10-CM | POA: Diagnosis not present

## 2017-11-18 DIAGNOSIS — Z9989 Dependence on other enabling machines and devices: Secondary | ICD-10-CM | POA: Diagnosis not present

## 2017-11-18 NOTE — Progress Notes (Addendum)
PATIENT: Evan Willis DOB: 04-Nov-1950  REASON FOR VISIT: follow up HISTORY FROM: patient  HISTORY OF PRESENT ILLNESS: Today 11/18/17   [name] is a 67 year old male with a history of obstructive sleep apnea on CPAP.  He returns today for follow-up.  His download indicates that he is 27 out of 30 days for compliance of 90%.  He uses machine greater than 4 hours 22 out of 30 days for compliance of 73%.  On average he uses his machine 5 hours and 6 minutes.  His residual AHI is 4 on 10 cmH2O with EPR of 3.  He has a leak in the 95th percentile at 25.8 L/min.  The patient reports that he continues to get good benefit from the CPAP.  He does admit that he is been unable to use the machine much in April due to allergies related to pollen.  He denies any new neurological symptoms.  He returns today for evaluation.   HISTORY 11/12/2016 (Copied from ): I reviewed his CPAP compliance data from 10/12/2016 through 11/10/2016, which is a total of 30 days, during which time he used his CPAP 29 days with percent used days greater than 4 hours at 93%, indicating excellent compliance with an average usage of 6 hours and 4 minutes, residual AHI 1.6 per hour, leak on the higher side with the 95th percentile at 23.3 L/m from a pressure of 11 cm with EPR of 3. He reports doing well with CPAP, no complaints. Weight gain, due to less activity likely. He is getting ready to the plant a garden for discharge. He will be more active at the time. He reports his latest A1c to be less than 7. Sometimes a CPAP pressure feels too high.  The patient's allergies, current medications, family history, past medical history, past social history, past surgical history and problem list were reviewed and updated as appropriate.    REVIEW OF SYSTEMS: Out of a complete 14 system review of symptoms, the patient complains only of the following symptoms, and all other reviewed systems are negative.  Eye discharge, frequency of  urination, numbness, weakness, tremors, aching muscles, apnea, snoring Fatigue severity scale 26 for sleepiness score 8    ALLERGIES: No Known Allergies  HOME MEDICATIONS: Outpatient Medications Prior to Visit  Medication Sig Dispense Refill  . allopurinol (ZYLOPRIM) 100 MG tablet Take 100 mg by mouth 2 (two) times daily.     Marland Kitchen amLODipine-atorvastatin (CADUET) 10-40 MG per tablet Take 1 tablet by mouth daily.    Marland Kitchen aspirin 81 MG tablet Take 81 mg by mouth daily.    Marland Kitchen doxazosin (CARDURA) 8 MG tablet Take 8 mg by mouth daily.    Marland Kitchen glipiZIDE (GLUCOTROL) 5 MG tablet Take 10 mg by mouth daily before breakfast.    . hydrochlorothiazide (HYDRODIURIL) 25 MG tablet Take 25 mg by mouth daily.    Marland Kitchen lisinopril (PRINIVIL,ZESTRIL) 40 MG tablet Take 40 mg by mouth daily.    . metFORMIN (GLUCOPHAGE) 1000 MG tablet   11  . metFORMIN (GLUMETZA) 1000 MG (MOD) 24 hr tablet Take 1,000 mg by mouth 2 (two) times daily.     . pioglitazone (ACTOS) 45 MG tablet Take 45 mg by mouth daily.    . potassium chloride (MICRO-K) 10 MEQ CR capsule TK 1 C  PO QD  3  . propranolol (INDERAL) 80 MG tablet Take 80 mg by mouth 2 (two) times daily.     No facility-administered medications prior to visit.  PAST MEDICAL HISTORY: Past Medical History:  Diagnosis Date  . Diabetes ([place])   . Gout   . High blood pressure   . Hyperlipemia   . Obesity   . Tremor     PAST SURGICAL HISTORY: Past Surgical History:  Procedure Laterality Date  . HERNIA REPAIR      FAMILY HISTORY: Family History  Problem Relation Age of Onset  . Breast cancer Mother   . High blood pressure Father   . Asthma Brother   . Glaucoma Brother   . Heart disease Sister     SOCIAL HISTORY: Social History   Socioeconomic History  . Marital status: Married    Spouse name: Not on file  . Number of children: 5  . Years of education: Trade sch.  . Highest education level: Not on file  Occupational History  . Occupation: Retired/ [employer]       Comment: Consulting civil engineer  Social Needs  . Financial resource strain: Not on file  . Food insecurity:    Worry: Not on file    Inability: Not on file  . Transportation needs:    Medical: Not on file    Non-medical: Not on file  Tobacco Use  . Smoking status: Former Research scientist (life sciences)  . Smokeless tobacco: Never Used  . Tobacco comment: Quit in 1986  Substance and Sexual Activity  . Alcohol use: No    Alcohol/week: 0.0 oz  . Drug use: No  . Sexual activity: Not on file  Lifestyle  . Physical activity:    Days per week: Not on file    Minutes per session: Not on file  . Stress: Not on file  Relationships  . Social connections:    Talks on phone: Not on file    Gets together: Not on file    Attends religious service: Not on file    Active member of club or organization: Not on file    Attends meetings of clubs or organizations: Not on file    Relationship status: Not on file  . Intimate partner violence:    Fear of current or ex partner: Not on file    Emotionally abused: Not on file    Physically abused: Not on file    Forced sexual activity: Not on file  Other Topics Concern  . Not on file  Social History Narrative   Patient is married and lives at home with his wife Blanch Media)   Patient is retired and works part time self employed. 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   Both handed.   Caffeine 1-2 cups per week.         PHYSICAL EXAM  Vitals:   11/18/17 0815  BP: 136/79  Pulse: (!) 59  Weight: 290 lb (131.5 kg)  Height: 6' (1.829 m)   Body mass index is 39.33 kg/m.  Generalized: Well developed, in no acute distress   Neurological examination  Mentation: Alert oriented to time, place, history taking. Follows all commands speech and language fluent Cranial nerve II-XII: Pupils were equal round reactive to light. Extraocular movements were full, visual field were full on confrontational test. Facial sensation and strength were normal. Uvula  tongue midline. Head turning and shoulder shrug  were normal and symmetric. Motor: The motor testing reveals 5 over 5 strength of all 4 extremities. Good symmetric motor tone is noted throughout.  Sensory: Sensory testing is intact to soft touch on all 4 extremities. No evidence of extinction is noted.  Coordination: Cerebellar testing reveals good finger-nose-finger and heel-to-shin bilaterally.  Gait and station: Gait is normal.  Reflexes: Deep tendon reflexes are symmetric and normal bilaterally.   DIAGNOSTIC DATA (LABS, IMAGING, TESTING) - I reviewed patient records, labs, notes, testing and imaging myself where available.  Lab Results  Component Value Date   WBC 6.6 06/05/2010   HGB 13.9 06/05/2010   HCT 42.2 06/05/2010   MCV 85.5 06/05/2010   PLT 205 06/05/2010      Component Value Date/Time   NA 141 06/05/2010 1220   K 4.1 06/05/2010 1220   CL 102 06/05/2010 1220   CO2 29 06/05/2010 1220   GLUCOSE 75 06/05/2010 1220   BUN 12 06/05/2010 1220   CREATININE 0.97 06/05/2010 1220   CALCIUM 9.4 06/05/2010 1220   PROT 6.9 06/05/2010 1220   ALBUMIN 4.2 06/05/2010 1220   AST 20 06/05/2010 1220   ALT 18 06/05/2010 1220   ALKPHOS 55 06/05/2010 1220   BILITOT 0.7 06/05/2010 1220   GFRNONAA >60 06/05/2010 1220   GFRAA  06/05/2010 1220    >60        The eGFR has been calculated using the MDRD equation. This calculation has not been validated in all clinical situations. eGFR's persistently <60 mL/min signify possible Chronic Kidney Disease.   No results found for: CHOL, HDL, LDLCALC, LDLDIRECT, TRIG, CHOLHDL No results found for: HGBA1C Lab Results  Component Value Date   VITAMINB12 469 11/30/2013   Lab Results  Component Value Date   TSH 0.982 11/30/2013      ASSESSMENT AND PLAN 67 y.o. year old male  has a past medical history of Diabetes ([place]), Gout, High blood pressure, Hyperlipemia, Obesity, and Tremor. here with:  1.  Obstructive sleep apnea on CPAP  The  patient CPAP download indicates good compliance and good treatment of his apnea.  He is encouraged to continue using the CPAP nightly.  He is advised that if his symptoms worsen or he develops new symptoms he should let us know.  He will follow-up in 1 year or sooner if needed.  I spent 15 minutes with the patient. 50% of this time was spent reviewing his CPAP download.   Ward Givens, MSN, NP-C 11/18/2017, 8:36 AM [facility] [address] [place], [place] [address] [phone]  I reviewed the above note and documentation by the Nurse Practitioner and agree with the history, physical exam, assessment and plan as outlined above. I was immediately available for face-to-face consultation. Star Age, MD, PhD [facility])

## 2017-11-18 NOTE — Patient Instructions (Signed)
Your Plan:  Continue using CPAP nightly and >4 hours each night If your symptoms worsen or you develop new symptoms please let us know.   Thank you for coming to see us at [facility]. I hope we have been able to provide you high quality care today.  You may receive a patient satisfaction survey over the next few weeks. We would appreciate your feedback and comments so that we may continue to improve ourselves and the health of our patients.  

## 2018-11-19 ENCOUNTER — Telehealth: Payer: Self-pay

## 2018-11-19 NOTE — Telephone Encounter (Signed)
Patient has declined to doing a virtual visit. Patient stated that he would like his appt cancelled and that he would call us at a later time to r/s. Appt has been cancelled.

## 2018-11-24 ENCOUNTER — Ambulatory Visit: Payer: Medicare Other | Admitting: Adult Health

## 2019-01-12 ENCOUNTER — Encounter: Payer: Self-pay | Admitting: Neurology

## 2019-01-22 ENCOUNTER — Encounter: Payer: Self-pay | Admitting: Neurology

## 2019-02-19 NOTE — Progress Notes (Signed)
Subjective:   Evan Willis was seen in consultation in the movement disorder clinic at the request of Koirala, Dibas, MD.  The evaluation is for tremor.  Prior medical records have been reviewed, including [facility].  Tremor started in approximately 2013 and involves the bilateral upper extremities.  He started seeing Dr. Terrace ArabiaYan for this in 2015.  However, tremor was felt to be fairly mild and they focused primarily on his sleep apnea, for which they have been seeing him ever since.  Pt states that the L hand tremor is worse than the right.  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  Tremor is most noticeable when eating or using weed eating/power tool.   There is no family hx of tremor.    Affected by caffeine:  No. (drinks 1 cup coffee in AM and 1 iced tea per day) Affected by alcohol: does not drink EtOH Affected by stress:  No. Affected by fatigue:  A little bit - worse after works in community garden Spills soup if on spoon:  Yes.   Spills glass of liquid if full:  Yes.  , if in the L hand (he is mostly L handed but writes with the R) Affects ADL's (tying shoes, brushing teeth, etc):  No.  Current/Previously tried tremor medications: propranolol 80 mg bid - helps  Current medications that may exacerbate tremor:  None  Outside reports reviewed: historical medical records, lab reports, office notes and referral letter/letters.  As above, the patient has a history of sleep apnea, [facility] which [facility].  He was last seen in May, 2019.  Patient reports that he is compliant with CPAP.  No Known Allergies  Outpatient Encounter Medications as of 02/22/2019  Medication Sig  . albuterol (VENTOLIN HFA) 108 (90 Base) MCG/ACT inhaler Inhale 1 puff into the lungs every 6 (six) hours as needed for wheezing or shortness of breath.  . allopurinol (ZYLOPRIM) 100 MG tablet Take 100 mg by mouth 2 (two) times  daily.   Marland Kitchen. amLODipine-atorvastatin (CADUET) 10-40 MG per tablet Take 1 tablet by mouth daily.  Marland Kitchen. aspirin 81 MG tablet Take 81 mg by mouth daily.  Marland Kitchen. doxazosin (CARDURA) 8 MG tablet Take 8 mg by mouth daily.  Marland Kitchen. glipiZIDE (GLUCOTROL) 5 MG tablet Take 10 mg by mouth daily before breakfast.  . hydrochlorothiazide (HYDRODIURIL) 25 MG tablet Take 25 mg by mouth daily.  Marland Kitchen. lisinopril (PRINIVIL,ZESTRIL) 40 MG tablet Take 40 mg by mouth daily.  . metFORMIN (GLUCOPHAGE) 1000 MG tablet   . metFORMIN (GLUMETZA) 1000 MG (MOD) 24 hr tablet Take 1,000 mg by mouth 2 (two) times daily.   . pioglitazone (ACTOS) 45 MG tablet Take 45 mg by mouth daily.  . potassium chloride (MICRO-K) 10 MEQ CR capsule TK 1 C  PO QD  . propranolol (INDERAL) 80 MG tablet Take 80 mg by mouth 2 (two) times daily.   No facility-administered encounter medications on file as of 02/22/2019.     Past Medical History:  Diagnosis Date  . Diabetes (HCC)   . Gout   . High blood pressure   . Hyperlipemia   . Obesity   . Tremor     Past Surgical History:  Procedure Laterality Date  . HERNIA REPAIR      Social History   Socioeconomic History  . Marital status: Married    Spouse name: Not on file  .  Number of children: 4  . Years of education: Trade sch.  . Highest education level: Some college, no degree  Occupational History  . 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 Financial resource strain: Not on file  . Food insecurity    Worry: Not on file    Inability: Not on file  . Transportation needs    Medical: Not on file    Non-medical: Not on file  Tobacco Use  . Smoking status: Former Games developermoker  . Smokeless tobacco: Never Used  . Tobacco comment: Quit in 1986  Substance and Sexual Activity  . Alcohol use: No    Alcohol/week: 0.0 standard drinks  . Drug use: No  . Sexual activity: Not on file  Lifestyle  . Physical activity    Days per week: Not on file    Minutes per  session: Not on file  . Stress: Not on file  Relationships  . Social Musicianconnections    Talks on phone: Not on file    Gets together: Not on file    Attends religious service: Not on file    Active member of club or organization: Not on file    Attends meetings of clubs or organizations: Not on file    Relationship status: Not on file  . Intimate partner violence    Fear of current or ex partner: Not on file    Emotionally abused: Not on file    Physically abused: Not on file    Forced sexual activity: Not on file  Other Topics Concern  . Not on file  Social History Narrative   Patient is married and lives at home with his wife Alona Bene(Joyce)   Patient is retired and works part time self employed. 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   Both handed.   Caffeine 1-2 cups per week.   4 biological children, 1 step daughter       Family Status  Relation Name Status  . Mother  Deceased at age 69  . Father  Deceased at age 69  . Brother  Alive  . Brother  Alive  . Sister  Alive  . Brother  Alive  . Son  Alive  . Daughter  Alive  . Daughter  Alive  . Daughter  Alive  . Sister 3 Deceased  . Brother  Deceased    Review of Systems Review of Systems  Constitutional: Negative.   HENT: Negative.   Eyes: Negative.   Respiratory: Negative.   Cardiovascular: Negative.   Gastrointestinal: Negative.   Genitourinary: Negative.   Musculoskeletal: Positive for joint pain (R knee).  Skin: Negative.   Endo/Heme/Allergies: Negative.      Objective:   VITALS:   Vitals:   02/22/19 1000  BP: 119/68  Pulse: (!) 58  Temp: 98.5 F (36.9 C)  SpO2: 95%  Weight: 293 lb 9.6 oz (133.2 kg)  Height: 5\' 9"  (1.753 m)   Gen:  Appears stated age and in NAD. HEENT:  Normocephalic, atraumatic. The mucous membranes are moist. The superficial temporal arteries are without ropiness or tenderness. Cardiovascular: brady.  Regular  Lungs: Clear to auscultation bilaterally. Neck:  There are no carotid bruits noted bilaterally.  NEUROLOGICAL:  Orientation:  The patient is alert and oriented x 3.  Recent and remote memory are intact.  Attention span and concentration are normal.  Able to name objects and repeat without trouble.  Fund of knowledge is  appropriate Cranial nerves: There is good facial symmetry.  Extraocular muscles are intact and visual fields are full to confrontational testing. Speech is fluent and clear. Soft palate rises symmetrically and there is no tongue deviation. Hearing is intact to conversational tone. Tone: Tone is good throughout. Sensation: Sensation is intact to light touch and pinprick throughout (facial, trunk, extremities). Vibration is intact at the bilateral big toe. There is no extinction with double simultaneous stimulation. There is no sensory dermatomal level identified. Coordination:  The patient has no dysdiadichokinesia or dysmetria. Motor: Strength is 5/5 in the bilateral upper and lower extremities.  Shoulder shrug is equal bilaterally.  There is no pronator drift.  There are no fasciculations noted. DTR's: Deep tendon reflexes are 2/4 at the bilateral biceps, triceps, brachioradialis, patella and trace at the bilateral achilles.  Plantar responses are downgoing bilaterally. Gait and Station: The patient is able to ambulate without difficulty. The patient is able to heel toe walk without any difficulty. The patient is able to ambulate in a tandem fashion. The patient is able to stand in the Romberg position.   MOVEMENT EXAM: Tremor:  There is  tremor in the UE, noted most significantly with action, the left much more so than the right.  It is overall mild in the right and mild to moderate in the left.  He has trouble with Archimedes spirals on the left, but while there is tremor on the right, he is able to draw the Archimedes spirals on the right.  There is no rest tremor.  He spills water all over when pouring water from one glass to  another when the full glasses in the left hand.  Patient had lab work on January 22, 2019.  TSH was 1.09.  Sodium was 141, potassium 4.2, chloride 104, CO2 27, BUN 20, 0.89 was creatinine.  Glucose was 83.  Hemoglobin A1c was 6.2.     Assessment/Plan:   1.  Essential Tremor.  - We discussed nature and pathophysiology.  His is asymmetric, which happens 30% of the time.  We discussed that this can continue to gradually get worse with time.  We discussed that some medications can worsen this, as can caffeine use.  We discussed medication therapy as well as surgical therapy.  He is on propranolol already, 80 mg twice per day, and this dose really cannot be increased given current bradycardia and relatively low blood pressure.  We also discussed primidone and ultimately decided to start him on that and slowly work up to primidone, 50 mg, 1 tablet twice per day.  We discussed risks, benefits, and side effects.  He expressed understanding.   -Discussed with the patient that he would not get complete control of her tremor on the left hand without surgical intervention.  He was shown HIPAA compliant videos of patients who have had DBS surgery done in the past.  I told the patient that likely we would just do right VIM surgery given that his left hand is much worse than the right.  However, he generally writes with his right hand, even though he does everything else with the left hand.  He and I talked about the risks and benefits of doing both sides (namely, 30% risk of loss of balance).  He is going to think these things over.  He is not that excited about surgery, but also is not sure that he can live this way, given that he is finding himself isolating himself more often and not wanting to  go out to eat because of the tremor.  2.  Obstructive sleep apnea syndrome  -Understands morbidity and mortality with untreated sleep apnea.  He has been [facility] in the past.  Is compliant with CPAP.   3.  Follow up is anticipated in the next 4-6 months, sooner should new neurologic issues arise.  Much greater than 50% of this visit was spent in counseling and coordinating care.  Total face to face time:  45 min  CC:  Koirala, Dibas, MD

## 2019-02-22 ENCOUNTER — Encounter: Payer: Self-pay | Admitting: Neurology

## 2019-02-22 ENCOUNTER — Other Ambulatory Visit: Payer: Self-pay

## 2019-02-22 ENCOUNTER — Ambulatory Visit (INDEPENDENT_AMBULATORY_CARE_PROVIDER_SITE_OTHER): Payer: Medicare Other | Admitting: Neurology

## 2019-02-22 VITALS — BP 119/68 | HR 58 | Temp 98.5°F | Ht 69.0 in | Wt 293.6 lb

## 2019-02-22 DIAGNOSIS — G250 Essential tremor: Secondary | ICD-10-CM

## 2019-02-22 DIAGNOSIS — G4733 Obstructive sleep apnea (adult) (pediatric): Secondary | ICD-10-CM

## 2019-02-22 MED ORDER — PRIMIDONE 50 MG PO TABS: 50.0000 mg | ORAL_TABLET | Freq: Two times a day (BID) | ORAL | 1 refills | Status: DC

## 2019-02-22 NOTE — Patient Instructions (Signed)
Start primidone - 50 mg - 1/2 tablet for a night for 4 nights then increase to 1 tablet at night for a week and then, if you have no side effects, you can increase to 1 tablet twice per day.

## 2019-05-24 ENCOUNTER — Other Ambulatory Visit: Payer: Self-pay | Admitting: *Deleted

## 2019-05-24 DIAGNOSIS — Z20822 Contact with and (suspected) exposure to covid-19: Secondary | ICD-10-CM

## 2019-05-25 LAB — NOVEL CORONAVIRUS, NAA: SARS-CoV-2, NAA: NOT DETECTED

## 2019-06-25 NOTE — Progress Notes (Signed)
Virtual Visit via Video Note The purpose of this virtual visit is to provide medical care while limiting exposure to the novel coronavirus.    Consent was obtained for video visit:  Yes.   Answered questions that patient had about telehealth interaction:  Yes.   I discussed the limitations, risks, security and privacy concerns of performing an evaluation and management service by telemedicine. I also discussed with the patient that there may be a patient responsible charge related to this service. The patient expressed understanding and agreed to proceed.  Pt location: Home Physician Location: office Name of referring provider:  Darrow Bussing, MD I connected with Janae Sauce at patients initiation/request on 06/29/2019 at  8:15 AM EST by video enabled telemedicine application and verified that I am speaking with the correct person using two identifiers. Pt MRN:  [id number] Pt DOB:  02/08/1951 Video Participants:  Janae Sauce;     History of Present Illness:  Patient seen today in follow-up for essential tremor.  He is on propranolol, 80 mg twice per day (could not be increased because of bradycardia and blood pressure).  Last visit, we started him on primidone and worked to 50 mg twice per day.  He reports that it has helped "a little."  Not spilling soup off of a spoon but may shake some with it.   Current Outpatient Medications on File Prior to Visit  Medication Sig Dispense Refill  . albuterol (VENTOLIN HFA) 108 (90 Base) MCG/ACT inhaler Inhale 1 puff into the lungs every 6 (six) hours as needed for wheezing or shortness of breath.    . allopurinol (ZYLOPRIM) 100 MG tablet Take 100 mg by mouth 2 (two) times daily.     Marland Kitchen amLODipine-atorvastatin (CADUET) 10-40 MG per tablet Take 1 tablet by mouth daily.    Marland Kitchen aspirin 81 MG tablet Take 81 mg by mouth daily.    Marland Kitchen doxazosin (CARDURA) 8 MG tablet Take 8 mg by mouth daily.    Marland Kitchen glipiZIDE (GLUCOTROL) 5 MG tablet Take 10 mg by mouth daily  before breakfast.    . hydrochlorothiazide (HYDRODIURIL) 25 MG tablet Take 25 mg by mouth daily.    Marland Kitchen lisinopril (PRINIVIL,ZESTRIL) 40 MG tablet Take 40 mg by mouth daily.    . metFORMIN (GLUCOPHAGE) 1000 MG tablet   11  . naproxen (NAPROSYN) 500 MG tablet Take 500 mg by mouth 2 (two) times daily with a meal.    . pioglitazone (ACTOS) 45 MG tablet Take 45 mg by mouth daily.    . potassium chloride (MICRO-K) 10 MEQ CR capsule TK 1 C  PO QD  3  . primidone (MYSOLINE) 50 MG tablet Take 1 tablet (50 mg total) by mouth 2 (two) times daily. 180 tablet 1  . propranolol (INDERAL) 80 MG tablet Take 80 mg by mouth 2 (two) times daily.     No current facility-administered medications on file prior to visit.     Observations/Objective:   There were no vitals filed for this visit. GEN:  The patient appears stated age and is in NAD.  Neurological examination:  Orientation: The patient is alert and oriented x3. Cranial nerves: There is good facial symmetry. There is no facial hypomimia.  The speech is fluent and clear. Soft palate rises symmetrically and there is no tongue deviation. Hearing is intact to conversational tone. Motor: Strength is at least antigravity x 4.   Shoulder shrug is equal and symmetric.  There is no pronator drift.  Movement examination: Tone:  unable Abnormal movements: No rest tremor.  No postural tremor, but he does have intention tremor, particularly in the left hand.  It is a little bit irregular. Coordination:  There is no decremation with RAM's, with hand opening and closing her finger taps bilaterally     Assessment and Plan:   1.  Essential tremor  -On propranolol, 80 mg twice per day.  Cannot be increased because of bradycardia and blood pressure already being on the low side  -Discussed whether or not to increase his primidone.  Ultimately, we decided to stay on the same dose.  He will continue with primidone, 50 mg twice per day.  He will let me know when he  needs refills on the medication.  He may need a higher dose in the future.  -Have discussed DBS with the patient.  2.  Obstructive sleep apnea syndrome.  -Compliant with CPAP.  Follow Up Instructions:  5-6 months.  -I discussed the assessment and treatment plan with the patient. The patient was provided an opportunity to ask questions and all were answered. The patient agreed with the plan and demonstrated an understanding of the instructions.   The patient was advised to call back or seek an in-person evaluation if the symptoms worsen or if the condition fails to improve as anticipated.    Total Time spent in visit with the patient was:  15 min, of which more than 50% of the time was spent in counseling .   Pt understands and agrees with the plan of care outlined.     Alonza Bogus, DO

## 2019-06-29 ENCOUNTER — Telehealth (INDEPENDENT_AMBULATORY_CARE_PROVIDER_SITE_OTHER): Payer: Medicare Other | Admitting: Neurology

## 2019-06-29 ENCOUNTER — Other Ambulatory Visit: Payer: Self-pay

## 2019-06-29 DIAGNOSIS — G4733 Obstructive sleep apnea (adult) (pediatric): Secondary | ICD-10-CM | POA: Diagnosis not present

## 2019-06-29 DIAGNOSIS — G250 Essential tremor: Secondary | ICD-10-CM | POA: Diagnosis not present

## 2019-07-23 ENCOUNTER — Other Ambulatory Visit: Payer: Self-pay | Admitting: Family Medicine

## 2019-07-23 DIAGNOSIS — I77811 Abdominal aortic ectasia: Secondary | ICD-10-CM

## 2019-07-29 ENCOUNTER — Ambulatory Visit
Admission: RE | Admit: 2019-07-29 | Discharge: 2019-07-29 | Disposition: A | Discharge status: Home / Self Care | Payer: Medicare PPO | Source: Ambulatory Visit | Attending: Family Medicine | Admitting: Family Medicine

## 2019-07-29 DIAGNOSIS — I77811 Abdominal aortic ectasia: Secondary | ICD-10-CM

## 2019-10-01 DIAGNOSIS — G4733 Obstructive sleep apnea (adult) (pediatric): Secondary | ICD-10-CM | POA: Diagnosis not present

## 2019-11-01 DIAGNOSIS — G4733 Obstructive sleep apnea (adult) (pediatric): Secondary | ICD-10-CM | POA: Diagnosis not present

## 2019-11-10 DIAGNOSIS — E1142 Type 2 diabetes mellitus with diabetic polyneuropathy: Secondary | ICD-10-CM | POA: Diagnosis not present

## 2019-11-16 NOTE — Progress Notes (Signed)
Assessment/Plan:    1.  Essential Tremor   -Continue propranolol, 80 mg twice per day.  Unable to be increased because of history of bradycardia and low blood pressure  -Gave him the option of whether or not to increase his primidone, 50 mg twice per day.  Ultimately, decided to stay on the same dose.  -Surgical interventions discussed.  -called pcp office since coreg added since last visit and pt already on propranolol.  Not sure that he needs 2 beta blockers, but did want to make sure that PCP aware.  2.  Sleep apnea  -Patient is compliant with CPAP therapy.  Subjective:   Evan Willis was seen today in follow up for essential tremor.  My previous records were reviewed prior to todays visit.  Med list indicates that coreg added since last visit. Pt denies falls.  Pt denies lightheadedness, near syncope.  No hallucinations.  Mood has been good.  Current prescribed movement disorder medications: Propranolol, 80 mg twice per day primidone, 50 mg twice per day (noting pt now on carvedilol)   ALLERGIES:  No Known Allergies  CURRENT MEDICATIONS:  Outpatient Encounter Medications as of 11/18/2019  Medication Sig  . albuterol (VENTOLIN HFA) 108 (90 Base) MCG/ACT inhaler Inhale 1 puff into the lungs every 6 (six) hours as needed for wheezing or shortness of breath.  . allopurinol (ZYLOPRIM) 100 MG tablet Take 100 mg by mouth 2 (two) times daily.   Marland Kitchen amLODipine-atorvastatin (CADUET) 10-40 MG per tablet Take 1 tablet by mouth daily.  Marland Kitchen aspirin 81 MG tablet Take 81 mg by mouth daily.  Marland Kitchen doxazosin (CARDURA) 8 MG tablet Take 8 mg by mouth daily.  Marland Kitchen glipiZIDE (GLUCOTROL) 5 MG tablet Take 5 mg by mouth daily before breakfast.   . lisinopril (PRINIVIL,ZESTRIL) 40 MG tablet Take 40 mg by mouth daily.  . metFORMIN (GLUCOPHAGE) 1000 MG tablet Take 1,000 mg by mouth 2 (two) times daily with a meal.   . potassium chloride (MICRO-K) 10 MEQ CR capsule TK 1 C  PO QD  . primidone (MYSOLINE) 50 MG  tablet Take 1 tablet (50 mg total) by mouth 2 (two) times daily.  . propranolol (INDERAL) 80 MG tablet Take 80 mg by mouth 2 (two) times daily.  . carvedilol (COREG) 12.5 MG tablet Take 1 tablet by mouth in the morning and at bedtime.  . diclofenac Sodium (VOLTAREN) 1 % GEL As needed  . furosemide (LASIX) 20 MG tablet Take 1 tablet by mouth daily.  . [DISCONTINUED] hydrochlorothiazide (HYDRODIURIL) 25 MG tablet Take 25 mg by mouth daily.  . [DISCONTINUED] naproxen (NAPROSYN) 500 MG tablet Take 500 mg by mouth 2 (two) times daily with a meal.  . [DISCONTINUED] pioglitazone (ACTOS) 45 MG tablet Take 45 mg by mouth daily.   No facility-administered encounter medications on file as of 11/18/2019.     Objective:    PHYSICAL EXAMINATION:    VITALS:   Vitals:   11/18/19 0840  BP: (!) 145/78  Pulse: 60  SpO2: 98%  Weight: 282 lb (127.9 kg)  Height: 5\' 8"  (1.727 m)    GEN:  The patient appears stated age and is in NAD. HEENT:  Normocephalic, atraumatic.  The mucous membranes are moist. The superficial temporal arteries are without ropiness or tenderness. CV:  RRR Lungs:  CTAB Neck/HEME:  There are no carotid bruits bilaterally.  Neurological examination:  Orientation: The patient is alert and oriented x3. Cranial nerves: There is good facial symmetry. The speech is  fluent and clear. Soft palate rises symmetrically and there is no tongue deviation. Hearing is intact to conversational tone. Sensation: Sensation is intact to light touch throughout Motor: Strength is at least antigravity x4.  Movement examination: Tone: There is normal tone in the UE/LE Abnormal movements: no rest tremor.  Mild intention tremor on the R and mild to mod on the L Coordination:  There is no decremation with RAM's Gait and Station: The patient ambulates well. I have reviewed and interpreted the following labs independently   Chemistry      Component Value Date/Time   NA 141 06/05/2010 1220   K 4.1  06/05/2010 1220   CL 102 06/05/2010 1220   CO2 29 06/05/2010 1220   BUN 12 06/05/2010 1220   CREATININE 0.97 06/05/2010 1220      Component Value Date/Time   CALCIUM 9.4 06/05/2010 1220   ALKPHOS 55 06/05/2010 1220   AST 20 06/05/2010 1220   ALT 18 06/05/2010 1220   BILITOT 0.7 06/05/2010 1220      Lab Results  Component Value Date   WBC 6.6 06/05/2010   HGB 13.9 06/05/2010   HCT 42.2 06/05/2010   MCV 85.5 06/05/2010   PLT 205 06/05/2010   Lab Results  Component Value Date   TSH 0.982 11/30/2013     Chemistry      Component Value Date/Time   NA 141 06/05/2010 1220   K 4.1 06/05/2010 1220   CL 102 06/05/2010 1220   CO2 29 06/05/2010 1220   BUN 12 06/05/2010 1220   CREATININE 0.97 06/05/2010 1220      Component Value Date/Time   CALCIUM 9.4 06/05/2010 1220   ALKPHOS 55 06/05/2010 1220   AST 20 06/05/2010 1220   ALT 18 06/05/2010 1220   BILITOT 0.7 06/05/2010 1220         Total time spent on today's visit was 30 minutes, including both face-to-face time and nonface-to-face time.  Time included that spent on review of records (prior notes available to me/labs/imaging if pertinent), discussing treatment and goals, answering patient's questions and coordinating care.  Cc:  Darrow Bussing, MD

## 2019-11-18 ENCOUNTER — Other Ambulatory Visit: Payer: Self-pay

## 2019-11-18 ENCOUNTER — Encounter: Payer: Self-pay | Admitting: Neurology

## 2019-11-18 ENCOUNTER — Telehealth: Payer: Self-pay

## 2019-11-18 ENCOUNTER — Ambulatory Visit (INDEPENDENT_AMBULATORY_CARE_PROVIDER_SITE_OTHER): Payer: Medicare PPO | Admitting: Neurology

## 2019-11-18 VITALS — BP 145/78 | HR 60 | Ht 68.0 in | Wt 282.0 lb

## 2019-11-18 DIAGNOSIS — G250 Essential tremor: Secondary | ICD-10-CM

## 2019-11-18 NOTE — Patient Instructions (Signed)
I'm asking your Koirala, Dibas, MD to see if you should be on both the carvedilol and the propranolol.  If you don't hear from your PCP or [place] in the next week, call us or them.  The physicians and staff at [facility] are committed to providing excellent care. You may receive a survey requesting feedback about your experience at our office. We strive to receive "very good" responses to the survey questions. If you feel that your experience would prevent you from giving the office a "very good " response, please contact our office to try to remedy the situation. We may be reached at [phone]. Thank you for taking the time out of your busy day to complete the survey.

## 2019-11-18 NOTE — Telephone Encounter (Signed)
Per Dr Tat patient was seen today and is on two beta blockers. Dr Tat prescribes propanolol 80mg  qd and pcp prescribes Coreg 12.5mg  bid.   Dr Tat does not think the patient needs to be on two beta blockers. Contacted pcp to seek clarification of additional beta blocker being prescribed. Left message for Dr or his nurse to contact office.  Received a call back while I was on lunch.   Left message for Docia Chuck, Dr Dewayne Hatch nurse to contact office because now she is at lunch.   Will await a call back.

## 2019-11-19 NOTE — Telephone Encounter (Signed)
Left Ann a message a detailed message. Requested a call back.

## 2019-11-22 NOTE — Telephone Encounter (Signed)
Left message for patient to contact office.

## 2019-11-22 NOTE — Telephone Encounter (Signed)
Spoke with patient and he states he stopped Propanolol a few months ago. Advised patient that we he came in and I reviewed his meds he said he was taking the Propanol. He states it was his mistake for saying he was taking something he is not. He states he did speak with someone from Dr Glendale Chard office and everything is handled.   Medication list has been updated.

## 2019-11-22 NOTE — Telephone Encounter (Signed)
Spoke with Alcario Drought from Dr Glendale Chard office and was informed that Dr Docia Chuck discontinued the Propanolol back in January. The patient was supposed to stop taking it and start Coreg bid. Dr Docia Chuck recommended the patient taker off propanolol if he is currently taking it.  She stated she was going to contact the patient and give him Dr Glendale Chard instructions. I informed her that the patient saw Dr Tat last week and she wanted the patient to remain on the Propanolol. She states she will make Dr Docia Chuck aware and get back with me later.   Informed her that I would inform Dr Tat.

## 2019-11-22 NOTE — Telephone Encounter (Signed)
Its okay if they want him off of propranolol.  He has few options for tremor control but if he needs the coreg, then pt may just have to deal with tremor or try and increase primidone.

## 2019-11-22 NOTE — Telephone Encounter (Signed)
Patient returned office phone call, spoke with AccessNurse 5/10 @ 12:03pm

## 2019-11-24 ENCOUNTER — Telehealth: Payer: Self-pay

## 2019-11-24 NOTE — Telephone Encounter (Signed)
Received call from [place] at Dr Glendale Chard office to verify if we could figure out what medications the patient is taking. She wanted to confirm the patient is not supposed to take propanolol and only taking the coreg 12.5mg  bid.   Informed her that I spoke with the patient and he stated he was not taking the propanolol. She voiced understanding.

## 2019-11-30 ENCOUNTER — Ambulatory Visit: Payer: Medicare Other | Admitting: Neurology

## 2019-12-01 DIAGNOSIS — G4733 Obstructive sleep apnea (adult) (pediatric): Secondary | ICD-10-CM | POA: Diagnosis not present

## 2019-12-10 ENCOUNTER — Other Ambulatory Visit: Payer: Self-pay | Admitting: Neurology

## 2019-12-10 MED ORDER — PRIMIDONE 50 MG PO TABS: 50.0000 mg | ORAL_TABLET | Freq: Two times a day (BID) | ORAL | 1 refills | Status: DC

## 2019-12-10 NOTE — Telephone Encounter (Signed)
Rx(s) sent to pharmacy electronically. Patient has been notified directly and voiced understanding.  

## 2019-12-10 NOTE — Telephone Encounter (Signed)
Patient is requesting a refill on Primidone 50mg  be sent in. States his pharmacy sent a fax with the refill request but they haven't received it back yet. Please call.

## 2019-12-15 ENCOUNTER — Encounter: Payer: Self-pay | Admitting: Neurology

## 2019-12-15 NOTE — Progress Notes (Addendum)
[place]: [id number] Need help? Call us at [phone]  Outcome  Additional Information Required  Available without authorization.  DrugPrimidone 50MG  tablets  Electronic PA Form  Called pharm to let them know and they said the patient has already come by to pick up medication from them. Good to go.

## 2020-01-01 DIAGNOSIS — G4733 Obstructive sleep apnea (adult) (pediatric): Secondary | ICD-10-CM | POA: Diagnosis not present

## 2020-01-20 DIAGNOSIS — E7800 Pure hypercholesterolemia, unspecified: Secondary | ICD-10-CM | POA: Diagnosis not present

## 2020-01-20 DIAGNOSIS — G250 Essential tremor: Secondary | ICD-10-CM | POA: Diagnosis not present

## 2020-01-20 DIAGNOSIS — I10 Essential (primary) hypertension: Secondary | ICD-10-CM | POA: Diagnosis not present

## 2020-01-20 DIAGNOSIS — E1169 Type 2 diabetes mellitus with other specified complication: Secondary | ICD-10-CM | POA: Diagnosis not present

## 2020-01-20 DIAGNOSIS — M109 Gout, unspecified: Secondary | ICD-10-CM | POA: Diagnosis not present

## 2020-01-20 DIAGNOSIS — Z79899 Other long term (current) drug therapy: Secondary | ICD-10-CM | POA: Diagnosis not present

## 2020-01-20 DIAGNOSIS — Z7984 Long term (current) use of oral hypoglycemic drugs: Secondary | ICD-10-CM | POA: Diagnosis not present

## 2020-01-31 DIAGNOSIS — G4733 Obstructive sleep apnea (adult) (pediatric): Secondary | ICD-10-CM | POA: Diagnosis not present

## 2020-02-09 DIAGNOSIS — L603 Nail dystrophy: Secondary | ICD-10-CM | POA: Diagnosis not present

## 2020-02-09 DIAGNOSIS — I739 Peripheral vascular disease, unspecified: Secondary | ICD-10-CM | POA: Diagnosis not present

## 2020-02-09 DIAGNOSIS — E1151 Type 2 diabetes mellitus with diabetic peripheral angiopathy without gangrene: Secondary | ICD-10-CM | POA: Diagnosis not present

## 2020-03-02 DIAGNOSIS — G4733 Obstructive sleep apnea (adult) (pediatric): Secondary | ICD-10-CM | POA: Diagnosis not present

## 2020-03-28 DIAGNOSIS — Z23 Encounter for immunization: Secondary | ICD-10-CM | POA: Diagnosis not present

## 2020-04-02 DIAGNOSIS — G4733 Obstructive sleep apnea (adult) (pediatric): Secondary | ICD-10-CM | POA: Diagnosis not present

## 2020-04-25 ENCOUNTER — Ambulatory Visit: Payer: Medicare PPO | Attending: Internal Medicine

## 2020-04-25 DIAGNOSIS — Z23 Encounter for immunization: Secondary | ICD-10-CM

## 2020-04-25 NOTE — Progress Notes (Signed)
   Covid-19 Vaccination Clinic  Name:  [name]    MRN: [id number] DOB: 1951/01/02  04/25/2020  Mr. Stfort was observed post Covid-19 immunization for 15 minutes without incident. He was provided with Vaccine Information Sheet and instruction to access the V-Safe system.   Mr. Wieman was instructed to call 911 with any severe reactions post vaccine: Marland Kitchen Difficulty breathing  . Swelling of face and throat  . A fast heartbeat  . A bad rash all over body  . Dizziness and weakness

## 2020-05-02 DIAGNOSIS — G4733 Obstructive sleep apnea (adult) (pediatric): Secondary | ICD-10-CM | POA: Diagnosis not present

## 2020-05-16 DIAGNOSIS — E1142 Type 2 diabetes mellitus with diabetic polyneuropathy: Secondary | ICD-10-CM | POA: Diagnosis not present

## 2020-05-18 NOTE — Progress Notes (Signed)
Assessment/Plan:    1.  Essential Tremor  -slowly increase primidone from 50 mg bid to primidone 50 mg, 3 in the AM, 2 at bed.  -Patient used to be on propranolol, but this was discontinued by primary care when he was started on Coreg.  -Discussed surgical interventions for essential tremor.  He asked details about focused ultrasound.  Was given information and contact info to [facility].    2.  Obstructive sleep apnea  -Patient on CPAP  Subjective:   [place] was seen today in follow up for essential tremor.  My previous records were reviewed prior to todays visit.  When I saw the patient last visit, he told me that he was on propranolol, but I also noticed carvedilol on his list.  I ended up calling his primary care physician, who called [place] back and stated that his primary care physician had discontinued his propranolol when they started the Coreg.  The patient was able to confirm that he was wrong when he was in the office and he did, in fact, stopped the propranolol.  Pt having to use 2 hands to eat/drink.  He has trouble writing but that is intermittent.  Current prescribed movement disorder medications:  Primidone, 50 mg twice per day  PREVIOUS MEDICATIONS: Propranolol 80 mg twice per day (primary care discontinued when they added Coreg, 12.5 mg twice per day)  ALLERGIES:  No Known Allergies  CURRENT MEDICATIONS:  Outpatient Encounter Medications as of 05/23/2020  Medication Sig  . allopurinol (ZYLOPRIM) 100 MG tablet Take 100 mg by mouth 2 (two) times daily.   Marland Kitchen amLODipine-atorvastatin (CADUET) 10-40 MG per tablet Take 1 tablet by mouth daily.  Marland Kitchen aspirin 81 MG tablet Take 81 mg by mouth daily.  . carvedilol (COREG) 12.5 MG tablet Take 1 tablet by mouth in the morning and at bedtime.  . diclofenac Sodium (VOLTAREN) 1 % GEL As needed  . doxazosin (CARDURA) 8 MG tablet Take 8 mg by mouth daily.  . furosemide (LASIX) 20 MG tablet Take 1 tablet by mouth daily.  Marland Kitchen  glipiZIDE (GLUCOTROL) 5 MG tablet Take 2.5 mg by mouth daily before breakfast.   . lisinopril (PRINIVIL,ZESTRIL) 40 MG tablet Take 40 mg by mouth daily.  . metFORMIN (GLUCOPHAGE) 1000 MG tablet Take 1,000 mg by mouth 2 (two) times daily with a meal.   . Multiple Vitamin (MULTIVITAMIN WITH MINERALS) TABS tablet Take 1 tablet by mouth daily.  . pioglitazone (ACTOS) 15 MG tablet Take 15 mg by mouth daily.  . potassium chloride (MICRO-K) 10 MEQ CR capsule TK 1 C  PO QD  . primidone (MYSOLINE) 50 MG tablet Take 1 tablet (50 mg total) by mouth 2 (two) times daily.  . [DISCONTINUED] albuterol (VENTOLIN HFA) 108 (90 Base) MCG/ACT inhaler Inhale 1 puff into the lungs every 6 (six) hours as needed for wheezing or shortness of breath. (Patient not taking: Reported on 05/23/2020)   No facility-administered encounter medications on file as of 05/23/2020.     Objective:    PHYSICAL EXAMINATION:    VITALS:   Vitals:   05/23/20 1041  BP: (!) 153/81  Pulse: 78  SpO2: 96%  Weight: 278 lb (126.1 kg)  Height: 5\' 9"  (1.753 m)    GEN:  The patient appears stated age and is in NAD. HEENT:  Normocephalic, atraumatic.  The mucous membranes are moist. The superficial temporal arteries are without ropiness or tenderness. CV:  RRR Lungs:  CTAB Neck/HEME:  There are  no carotid bruits bilaterally.  Neurological examination:  Orientation: The patient is alert and oriented x3. Cranial nerves: There is good facial symmetry. The speech is fluent and clear. Soft palate rises symmetrically and there is no tongue deviation. Hearing is intact to conversational tone. Sensation: Sensation is intact to light touch throughout Motor: Strength is at least antigravity x4.  Movement examination: Tone: There is normal tone in the UE/LE Abnormal movements: no rest tremor.  There is at least mod intention tremor, L>R Coordination:  There is no decremation with RAM's Gait and Station: The patient has mild difficulty  arising out of a deep-seated chair without the use of the hands. The patient's stride length is good I have reviewed and interpreted the following labs independently   Chemistry      Component Value Date/Time   NA 141 06/05/2010 1220   K 4.1 06/05/2010 1220   CL 102 06/05/2010 1220   CO2 29 06/05/2010 1220   BUN 12 06/05/2010 1220   CREATININE 0.97 06/05/2010 1220      Component Value Date/Time   CALCIUM 9.4 06/05/2010 1220   ALKPHOS 55 06/05/2010 1220   AST 20 06/05/2010 1220   ALT 18 06/05/2010 1220   BILITOT 0.7 06/05/2010 1220      Lab Results  Component Value Date   WBC 6.6 06/05/2010   HGB 13.9 06/05/2010   HCT 42.2 06/05/2010   MCV 85.5 06/05/2010   PLT 205 06/05/2010   Lab Results  Component Value Date   TSH 0.982 11/30/2013     Chemistry      Component Value Date/Time   NA 141 06/05/2010 1220   K 4.1 06/05/2010 1220   CL 102 06/05/2010 1220   CO2 29 06/05/2010 1220   BUN 12 06/05/2010 1220   CREATININE 0.97 06/05/2010 1220      Component Value Date/Time   CALCIUM 9.4 06/05/2010 1220   ALKPHOS 55 06/05/2010 1220   AST 20 06/05/2010 1220   ALT 18 06/05/2010 1220   BILITOT 0.7 06/05/2010 1220         Total time spent on today's visit was 40 minutes, including both face-to-face time and nonface-to-face time.  Time included that spent on review of records (prior notes available to me/labs/imaging if pertinent), discussing treatment and goals, answering patient's questions and coordinating care.  Cc:  Darrow Bussing, MD

## 2020-05-23 ENCOUNTER — Encounter: Payer: Self-pay | Admitting: Neurology

## 2020-05-23 ENCOUNTER — Other Ambulatory Visit: Payer: Self-pay

## 2020-05-23 ENCOUNTER — Ambulatory Visit: Payer: Medicare PPO | Admitting: Neurology

## 2020-05-23 VITALS — BP 153/81 | HR 78 | Ht 69.0 in | Wt 278.0 lb

## 2020-05-23 DIAGNOSIS — G250 Essential tremor: Secondary | ICD-10-CM

## 2020-05-23 MED ORDER — PRIMIDONE 50 MG PO TABS: ORAL_TABLET | ORAL | 1 refills | Status: DC

## 2020-05-23 NOTE — Patient Instructions (Addendum)
Week 1: Take primidone, 50 mg, 2 in the AM and 1 at night  Week 2:  Take primidone, 50 mg, 2 in the AM and 2 at night  Week 3:  Take primidone, 50 mg, 3 in the AM and 2 at night  [facility] in [place] is all self referral.  You may call the following number to see if you are a candidate at their institution:  [phone].  Their email is: [email or web address].  Please let us know the outcome of this phone call since they don't allow [place] to refer and therefore we don't get feedback ourselves.  Thank you!   The physicians and staff at [facility] are committed to providing excellent care. You may receive a survey requesting feedback about your experience at our office. We strive to receive "very good" responses to the survey questions. If you feel that your experience would prevent you from giving the office a "very good " response, please contact our office to try to remedy the situation. We may be reached at [phone]. Thank you for taking the time out of your busy day to complete the survey.

## 2020-06-02 DIAGNOSIS — G4733 Obstructive sleep apnea (adult) (pediatric): Secondary | ICD-10-CM | POA: Diagnosis not present

## 2020-06-15 DIAGNOSIS — E119 Type 2 diabetes mellitus without complications: Secondary | ICD-10-CM | POA: Diagnosis not present

## 2020-07-02 DIAGNOSIS — G4733 Obstructive sleep apnea (adult) (pediatric): Secondary | ICD-10-CM | POA: Diagnosis not present

## 2020-08-02 DIAGNOSIS — G4733 Obstructive sleep apnea (adult) (pediatric): Secondary | ICD-10-CM | POA: Diagnosis not present

## 2020-08-03 DIAGNOSIS — Z79899 Other long term (current) drug therapy: Secondary | ICD-10-CM | POA: Diagnosis not present

## 2020-08-03 DIAGNOSIS — G250 Essential tremor: Secondary | ICD-10-CM | POA: Diagnosis not present

## 2020-08-03 DIAGNOSIS — E1169 Type 2 diabetes mellitus with other specified complication: Secondary | ICD-10-CM | POA: Diagnosis not present

## 2020-08-03 DIAGNOSIS — Z125 Encounter for screening for malignant neoplasm of prostate: Secondary | ICD-10-CM | POA: Diagnosis not present

## 2020-08-03 DIAGNOSIS — E7800 Pure hypercholesterolemia, unspecified: Secondary | ICD-10-CM | POA: Diagnosis not present

## 2020-08-03 DIAGNOSIS — G4733 Obstructive sleep apnea (adult) (pediatric): Secondary | ICD-10-CM | POA: Diagnosis not present

## 2020-08-03 DIAGNOSIS — I10 Essential (primary) hypertension: Secondary | ICD-10-CM | POA: Diagnosis not present

## 2020-08-03 DIAGNOSIS — Z0001 Encounter for general adult medical examination with abnormal findings: Secondary | ICD-10-CM | POA: Diagnosis not present

## 2020-08-03 DIAGNOSIS — M109 Gout, unspecified: Secondary | ICD-10-CM | POA: Diagnosis not present

## 2020-08-17 DIAGNOSIS — Z79899 Other long term (current) drug therapy: Secondary | ICD-10-CM | POA: Diagnosis not present

## 2020-08-31 DIAGNOSIS — I739 Peripheral vascular disease, unspecified: Secondary | ICD-10-CM | POA: Diagnosis not present

## 2020-08-31 DIAGNOSIS — L603 Nail dystrophy: Secondary | ICD-10-CM | POA: Diagnosis not present

## 2020-08-31 DIAGNOSIS — E1142 Type 2 diabetes mellitus with diabetic polyneuropathy: Secondary | ICD-10-CM | POA: Diagnosis not present

## 2020-08-31 DIAGNOSIS — E1151 Type 2 diabetes mellitus with diabetic peripheral angiopathy without gangrene: Secondary | ICD-10-CM | POA: Diagnosis not present

## 2020-09-02 DIAGNOSIS — G4733 Obstructive sleep apnea (adult) (pediatric): Secondary | ICD-10-CM | POA: Diagnosis not present

## 2020-09-30 DIAGNOSIS — G4733 Obstructive sleep apnea (adult) (pediatric): Secondary | ICD-10-CM | POA: Diagnosis not present

## 2020-10-31 DIAGNOSIS — G4733 Obstructive sleep apnea (adult) (pediatric): Secondary | ICD-10-CM | POA: Diagnosis not present

## 2020-11-10 NOTE — Progress Notes (Signed)
Assessment/Plan:    1.  Essential Tremor  -Continue primidone, 50 mg, 3 tablets in the morning, 2 tablets at bed.  I am not sure increasing this is going to help anymore.  The right hand is fairly well controlled, but the left hand is not.  -Patient used to be on propranolol, but this was discontinued by primary care when he was started on Coreg.  -Discussed surgical interventions for essential tremor.  Gave him information for focused ultrasound.  He had decided to hold on pursuing it until we talked today.  He may be interested.  I do not think it would be worth it for the right hand, but potentially for the left.  2.  Obstructive sleep apnea  -Patient on CPAP  Subjective:   [place] was seen today in follow up for essential tremor.  My previous records were reviewed prior to todays visit.  He notes no worsening of tremor.  He notes that physical labor makes tremor worse.  He has to carry a cup with 2 hands.  While he writes with the right hand, he does a lot of things with the left hand.  Current prescribed movement disorder medications:  Primidone, 50 mg, 3 tablets in the morning, 2 tablets at bed (increased from 50 mg twice per day)  PREVIOUS MEDICATIONS: Propranolol 80 mg twice per day (primary care discontinued when they added Coreg, 12.5 mg twice per day)  ALLERGIES:  No Known Allergies  CURRENT MEDICATIONS:  Outpatient Encounter Medications as of 11/14/2020  Medication Sig  . allopurinol (ZYLOPRIM) 100 MG tablet Take 100 mg by mouth 2 (two) times daily.   Marland Kitchen amLODipine-atorvastatin (CADUET) 10-40 MG per tablet Take 1 tablet by mouth daily.  Marland Kitchen aspirin 81 MG tablet Take 81 mg by mouth daily.  . carvedilol (COREG) 12.5 MG tablet Take 1 tablet by mouth in the morning and at bedtime.  . diclofenac Sodium (VOLTAREN) 1 % GEL As needed  . doxazosin (CARDURA) 8 MG tablet Take 8 mg by mouth daily.  . furosemide (LASIX) 20 MG tablet Take 1 tablet by mouth daily.  Marland Kitchen glipiZIDE  (GLUCOTROL) 5 MG tablet Take 2.5 mg by mouth daily before breakfast.   . lisinopril (PRINIVIL,ZESTRIL) 40 MG tablet Take 40 mg by mouth daily.  . metFORMIN (GLUCOPHAGE) 1000 MG tablet Take 1,000 mg by mouth 2 (two) times daily with a meal.   . Multiple Vitamin (MULTIVITAMIN WITH MINERALS) TABS tablet Take 1 tablet by mouth daily.  . pioglitazone (ACTOS) 15 MG tablet Take 15 mg by mouth daily.  . potassium chloride (MICRO-K) 10 MEQ CR capsule TK 1 C  PO QD  . primidone (MYSOLINE) 50 MG tablet 3 in the AM and 2 at night as directed   No facility-administered encounter medications on file as of 11/14/2020.     Objective:    PHYSICAL EXAMINATION:    VITALS:   Vitals:   11/14/20 1112  Weight: 278 lb (126.1 kg)  Height: 5\' 9"  (1.753 m)    GEN:  The patient appears stated age and is in NAD. HEENT:  Normocephalic, atraumatic.  The mucous membranes are moist. The superficial temporal arteries are without ropiness or tenderness. CV:  RRR Lungs:  CTAB Neck/HEME:  There are no carotid bruits bilaterally.  Neurological examination:  Orientation: The patient is alert and oriented x3. Cranial nerves: There is good facial symmetry. The speech is fluent and clear. Soft palate rises symmetrically and there is no tongue deviation. Hearing  is intact to conversational tone. Sensation: Sensation is intact to light touch throughout Motor: Strength is at least antigravity x4.  Movement examination: Tone: There is normal tone in the UE/LE Abnormal movements: no rest tremor.  He has very little postural tremor on the right.  He has moderate to severe intention tremor on the left, but actually does fairly well on the right.  He has significant trouble with Archimedes spirals on the left and basically cannot do them Coordination:  There is no decremation with RAM's Gait and Station: The patient has mild difficulty arising out of a deep-seated chair without the use of the hands. The patient's stride length  is good I have reviewed and interpreted the following labs independently   Chemistry      Component Value Date/Time   NA 141 06/05/2010 1220   K 4.1 06/05/2010 1220   CL 102 06/05/2010 1220   CO2 29 06/05/2010 1220   BUN 12 06/05/2010 1220   CREATININE 0.97 06/05/2010 1220      Component Value Date/Time   CALCIUM 9.4 06/05/2010 1220   ALKPHOS 55 06/05/2010 1220   AST 20 06/05/2010 1220   ALT 18 06/05/2010 1220   BILITOT 0.7 06/05/2010 1220      Lab Results  Component Value Date   WBC 6.6 06/05/2010   HGB 13.9 06/05/2010   HCT 42.2 06/05/2010   MCV 85.5 06/05/2010   PLT 205 06/05/2010   Lab Results  Component Value Date   TSH 0.982 11/30/2013     Chemistry      Component Value Date/Time   NA 141 06/05/2010 1220   K 4.1 06/05/2010 1220   CL 102 06/05/2010 1220   CO2 29 06/05/2010 1220   BUN 12 06/05/2010 1220   CREATININE 0.97 06/05/2010 1220      Component Value Date/Time   CALCIUM 9.4 06/05/2010 1220   ALKPHOS 55 06/05/2010 1220   AST 20 06/05/2010 1220   ALT 18 06/05/2010 1220   BILITOT 0.7 06/05/2010 1220       Cc:  Koirala, Dibas, MD

## 2020-11-14 ENCOUNTER — Encounter: Payer: Self-pay | Admitting: Neurology

## 2020-11-14 ENCOUNTER — Other Ambulatory Visit: Payer: Self-pay

## 2020-11-14 ENCOUNTER — Ambulatory Visit: Payer: Medicare PPO | Admitting: Neurology

## 2020-11-14 VITALS — BP 148/90 | HR 78 | Ht 69.0 in | Wt 278.0 lb

## 2020-11-14 DIAGNOSIS — G250 Essential tremor: Secondary | ICD-10-CM | POA: Diagnosis not present

## 2020-11-14 NOTE — Patient Instructions (Signed)
[facility] in [place] is all self referral.  You may call the following number to see if you are a candidate at their institution:  [phone].  Their email is: [email or web address].  Please let us know the outcome of this phone call since they don't allow [place] to refer and therefore we don't get feedback ourselves.  Thank you!

## 2020-11-16 ENCOUNTER — Other Ambulatory Visit: Payer: Self-pay | Admitting: Neurology

## 2020-11-30 DIAGNOSIS — I739 Peripheral vascular disease, unspecified: Secondary | ICD-10-CM | POA: Diagnosis not present

## 2020-11-30 DIAGNOSIS — L603 Nail dystrophy: Secondary | ICD-10-CM | POA: Diagnosis not present

## 2020-11-30 DIAGNOSIS — E1151 Type 2 diabetes mellitus with diabetic peripheral angiopathy without gangrene: Secondary | ICD-10-CM | POA: Diagnosis not present

## 2020-11-30 DIAGNOSIS — G4733 Obstructive sleep apnea (adult) (pediatric): Secondary | ICD-10-CM | POA: Diagnosis not present

## 2020-12-31 DIAGNOSIS — G4733 Obstructive sleep apnea (adult) (pediatric): Secondary | ICD-10-CM | POA: Diagnosis not present

## 2021-01-30 DIAGNOSIS — G4733 Obstructive sleep apnea (adult) (pediatric): Secondary | ICD-10-CM | POA: Diagnosis not present

## 2021-01-30 DIAGNOSIS — Z79899 Other long term (current) drug therapy: Secondary | ICD-10-CM | POA: Diagnosis not present

## 2021-01-30 DIAGNOSIS — E1169 Type 2 diabetes mellitus with other specified complication: Secondary | ICD-10-CM | POA: Diagnosis not present

## 2021-01-30 DIAGNOSIS — G250 Essential tremor: Secondary | ICD-10-CM | POA: Diagnosis not present

## 2021-01-30 DIAGNOSIS — M109 Gout, unspecified: Secondary | ICD-10-CM | POA: Diagnosis not present

## 2021-01-30 DIAGNOSIS — E7800 Pure hypercholesterolemia, unspecified: Secondary | ICD-10-CM | POA: Diagnosis not present

## 2021-01-30 DIAGNOSIS — I10 Essential (primary) hypertension: Secondary | ICD-10-CM | POA: Diagnosis not present

## 2021-02-20 ENCOUNTER — Other Ambulatory Visit: Payer: Self-pay | Admitting: Neurology

## 2021-03-01 DIAGNOSIS — E1142 Type 2 diabetes mellitus with diabetic polyneuropathy: Secondary | ICD-10-CM | POA: Diagnosis not present

## 2021-03-02 DIAGNOSIS — G4733 Obstructive sleep apnea (adult) (pediatric): Secondary | ICD-10-CM | POA: Diagnosis not present

## 2021-04-02 DIAGNOSIS — G4733 Obstructive sleep apnea (adult) (pediatric): Secondary | ICD-10-CM | POA: Diagnosis not present

## 2021-04-10 DIAGNOSIS — Z23 Encounter for immunization: Secondary | ICD-10-CM | POA: Diagnosis not present

## 2021-04-24 DIAGNOSIS — G250 Essential tremor: Secondary | ICD-10-CM | POA: Diagnosis not present

## 2021-04-24 DIAGNOSIS — I10 Essential (primary) hypertension: Secondary | ICD-10-CM | POA: Diagnosis not present

## 2021-04-24 DIAGNOSIS — G8929 Other chronic pain: Secondary | ICD-10-CM | POA: Diagnosis not present

## 2021-04-24 DIAGNOSIS — E785 Hyperlipidemia, unspecified: Secondary | ICD-10-CM | POA: Diagnosis not present

## 2021-04-24 DIAGNOSIS — G4733 Obstructive sleep apnea (adult) (pediatric): Secondary | ICD-10-CM | POA: Diagnosis not present

## 2021-04-24 DIAGNOSIS — E1151 Type 2 diabetes mellitus with diabetic peripheral angiopathy without gangrene: Secondary | ICD-10-CM | POA: Diagnosis not present

## 2021-04-24 DIAGNOSIS — M109 Gout, unspecified: Secondary | ICD-10-CM | POA: Diagnosis not present

## 2021-04-24 DIAGNOSIS — E1142 Type 2 diabetes mellitus with diabetic polyneuropathy: Secondary | ICD-10-CM | POA: Diagnosis not present

## 2021-05-02 DIAGNOSIS — G4733 Obstructive sleep apnea (adult) (pediatric): Secondary | ICD-10-CM | POA: Diagnosis not present

## 2021-05-31 DIAGNOSIS — I739 Peripheral vascular disease, unspecified: Secondary | ICD-10-CM | POA: Diagnosis not present

## 2021-05-31 DIAGNOSIS — E1151 Type 2 diabetes mellitus with diabetic peripheral angiopathy without gangrene: Secondary | ICD-10-CM | POA: Diagnosis not present

## 2021-05-31 DIAGNOSIS — L603 Nail dystrophy: Secondary | ICD-10-CM | POA: Diagnosis not present

## 2021-06-04 ENCOUNTER — Other Ambulatory Visit: Payer: Self-pay | Admitting: Neurology

## 2021-06-04 DIAGNOSIS — G250 Essential tremor: Secondary | ICD-10-CM

## 2021-06-04 NOTE — Telephone Encounter (Signed)
Patient called to check on refills for his primidone 50 MG.

## 2021-06-26 DIAGNOSIS — E119 Type 2 diabetes mellitus without complications: Secondary | ICD-10-CM | POA: Diagnosis not present

## 2021-07-26 IMAGING — US US AORTA
1 series · 14 of 23 positions shown · non-contrast
Comparison: 07/18/2015

CLINICAL DATA: Follow-up ectatic abdominal aorta.  Smoking history

EXAM:
ULTRASOUND OF ABDOMINAL AORTA
TECHNIQUE: Ultrasound examination of the abdominal aorta and proximal common
iliac arteries was performed to evaluate for aneurysm. Additional
color and Doppler images of the distal aorta were obtained to
document patency.

[Series 1: us aorta · 0.28mm/px · 14 of 23 slices shown]
[im 1/23]
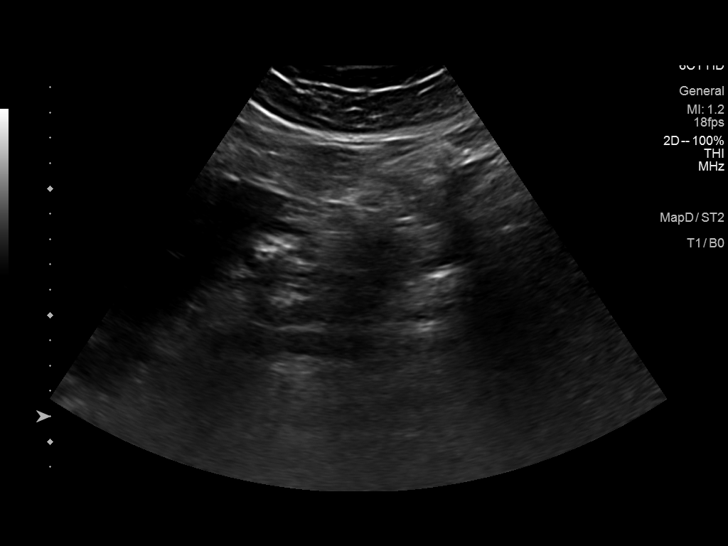
[im 3/23]
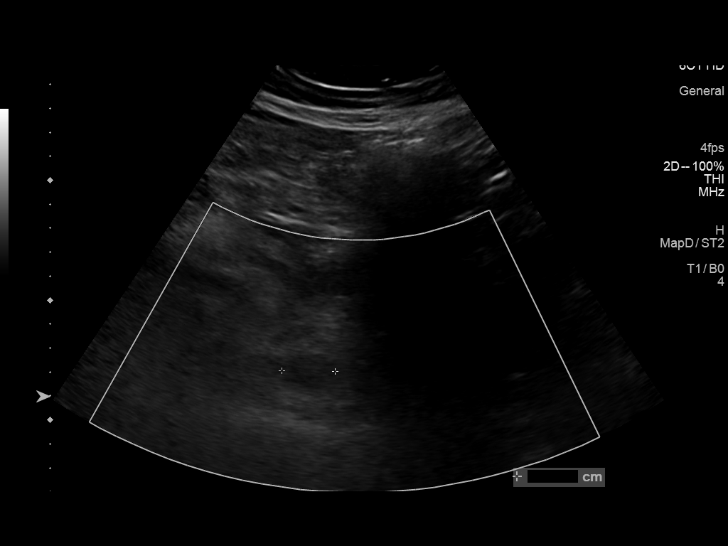
[im 5/23]
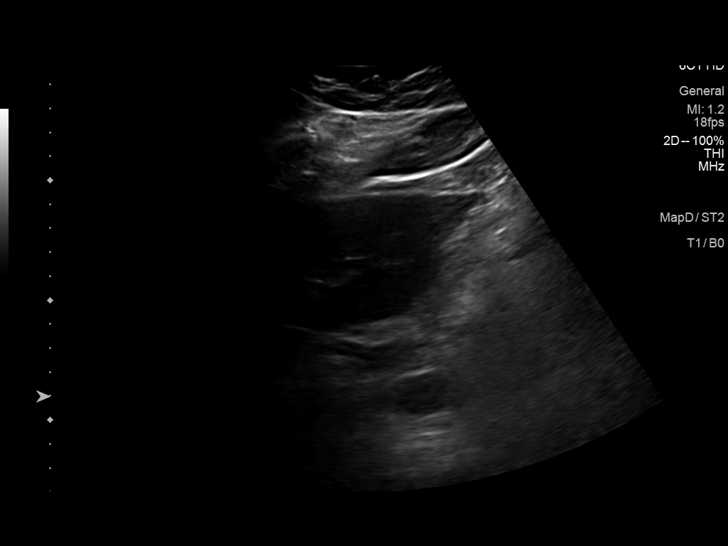
[im 6/23]
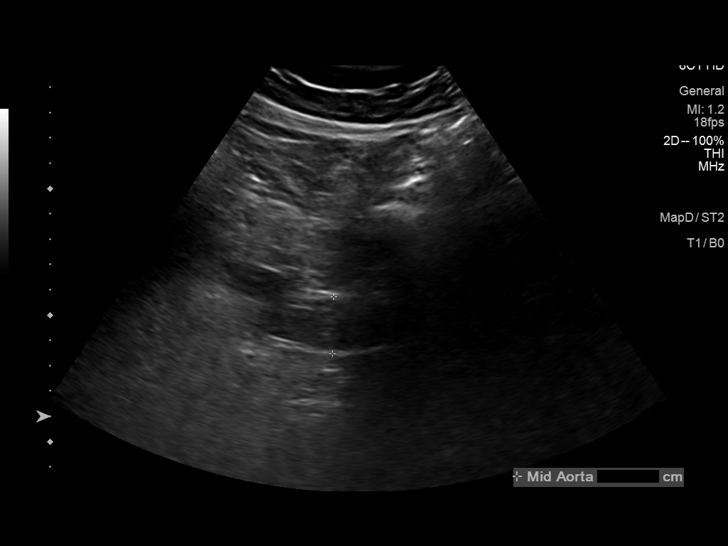
[im 8/23]
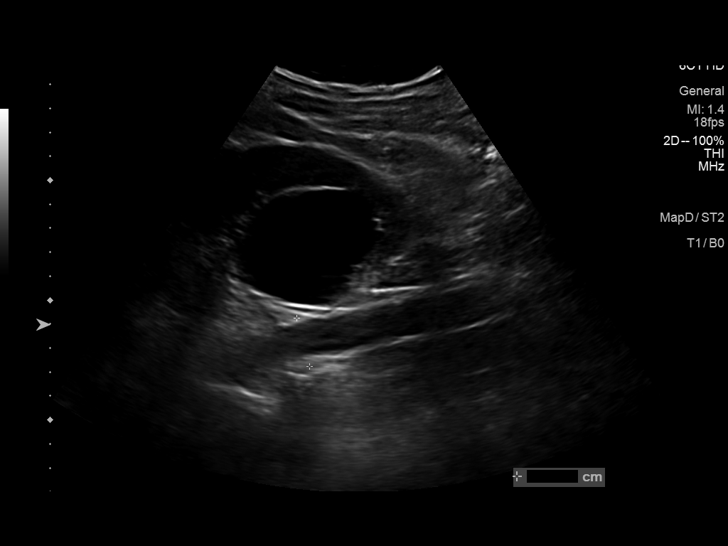
[im 10/23]
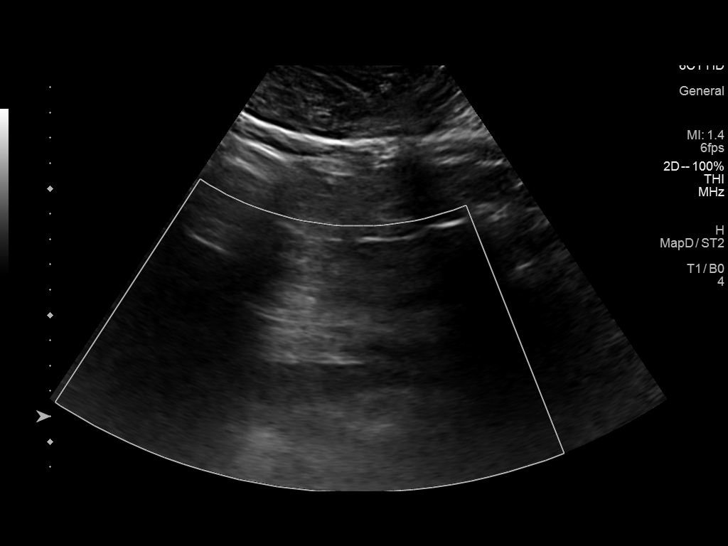
[im 11/23]
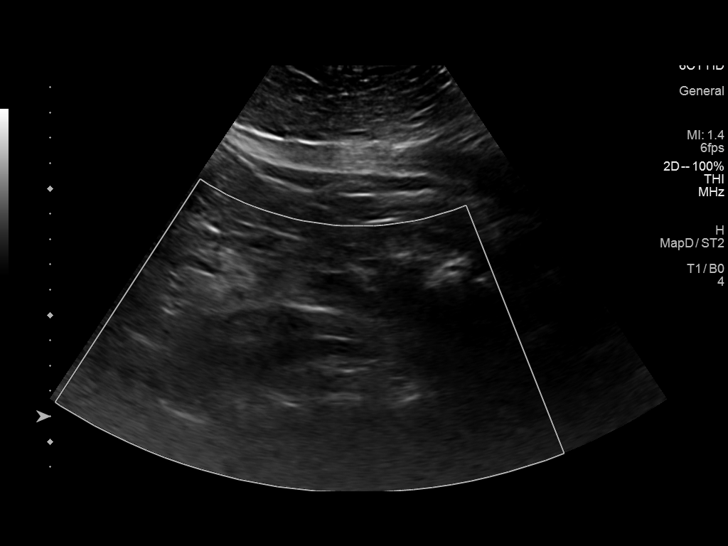
[im 13/23]
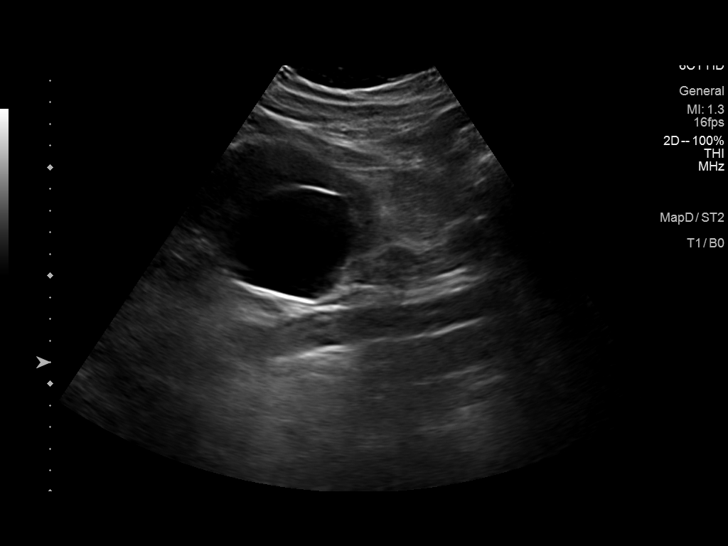
[im 14/23]
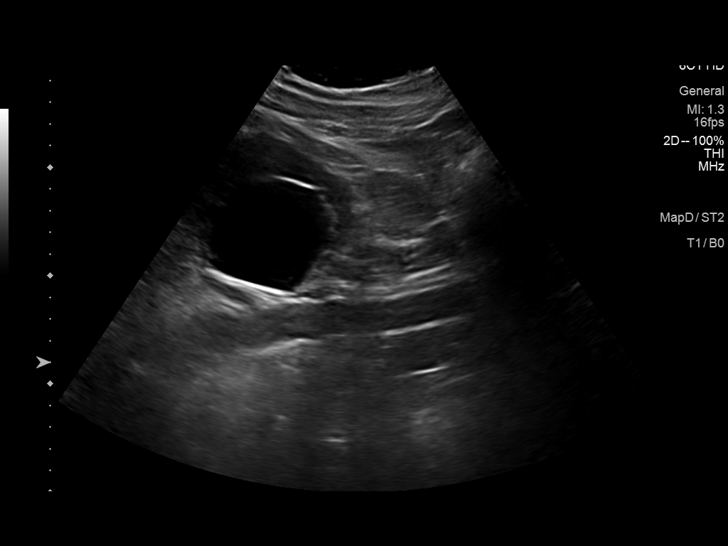
[im 16/23]
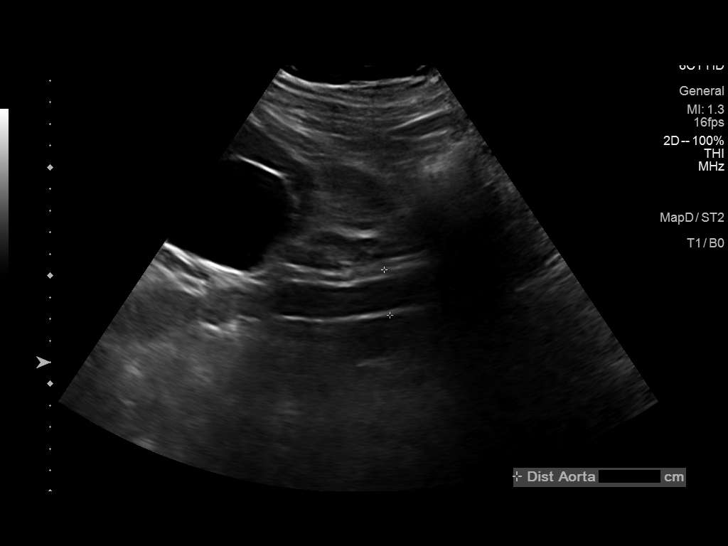
[im 18/23]
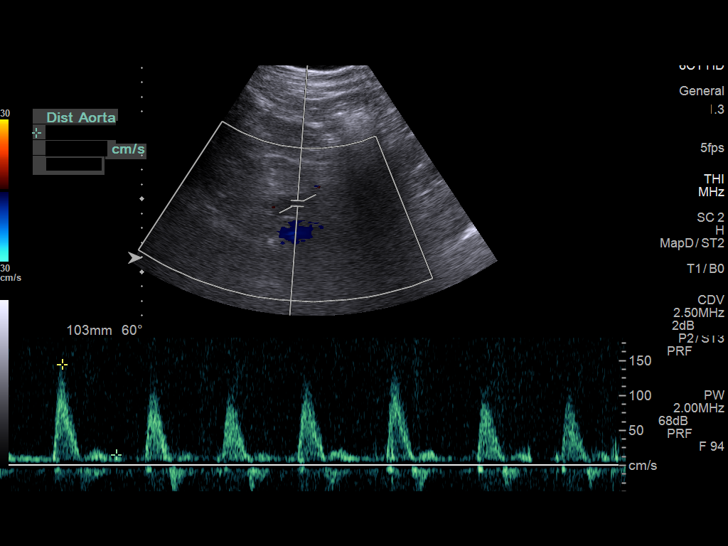
[im 19/23]
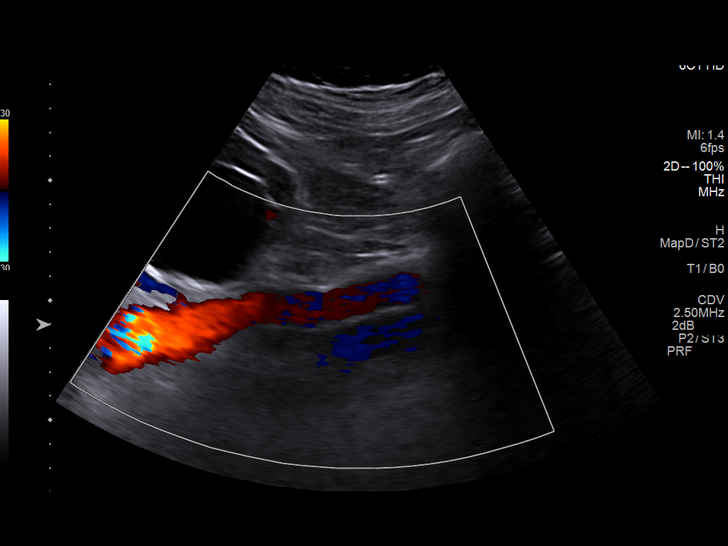
[im 21/23]
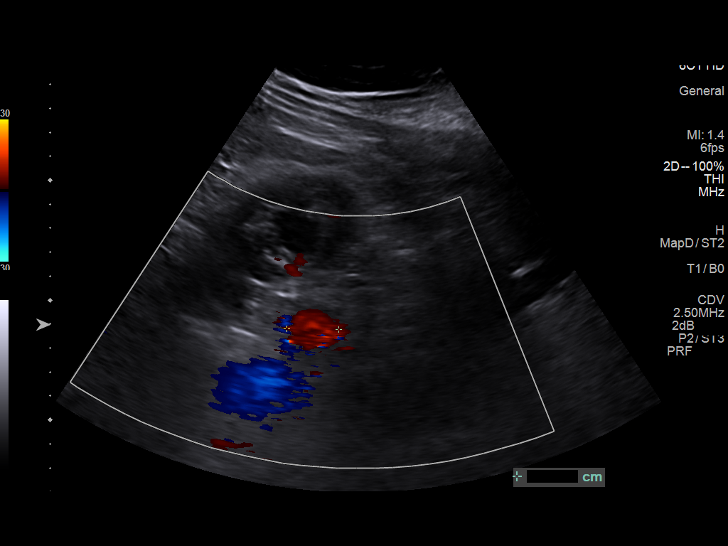
[im 23/23]
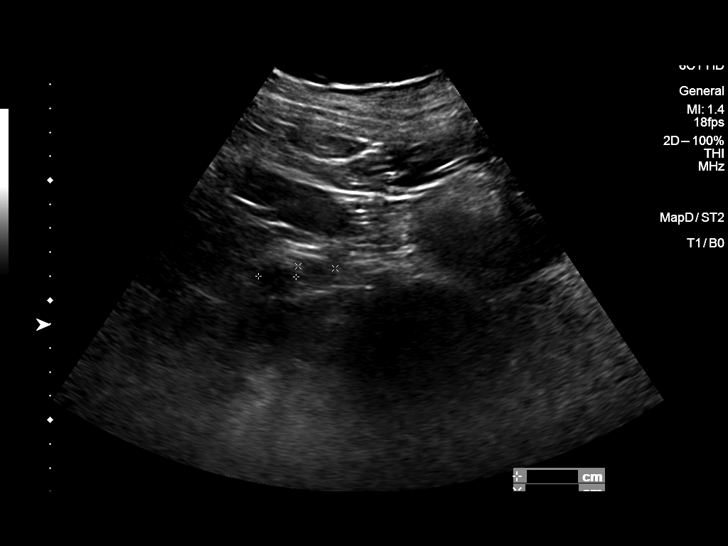

[14 of 23 positions shown; findings below may reference images not displayed]

FINDINGS: Technical note: Examination quality is degraded secondary to poor
penetration from patient body habitus.

Abdominal aortic measurements as follows:

Proximal:  2.2 x 2.2 cm

Mid:  2.3 x 2.1 cm

Distal:  2.1 x 2.2 cm
Patent: Yes, peak systolic velocity is 145 cm/s

Right common iliac artery: 1.3 x 1.6 cm

Left common iliac artery: 1.3 x 1.5 cm

Atherosclerotic plaques are noted throughout the aorta.
IMPRESSION: Slightly limited exam. No sonographic evidence of abdominal aortic
aneurysm.

## 2021-08-07 DIAGNOSIS — H6121 Impacted cerumen, right ear: Secondary | ICD-10-CM | POA: Diagnosis not present

## 2021-08-07 DIAGNOSIS — Z125 Encounter for screening for malignant neoplasm of prostate: Secondary | ICD-10-CM | POA: Diagnosis not present

## 2021-08-07 DIAGNOSIS — M109 Gout, unspecified: Secondary | ICD-10-CM | POA: Diagnosis not present

## 2021-08-07 DIAGNOSIS — Z79899 Other long term (current) drug therapy: Secondary | ICD-10-CM | POA: Diagnosis not present

## 2021-08-07 DIAGNOSIS — E1169 Type 2 diabetes mellitus with other specified complication: Secondary | ICD-10-CM | POA: Diagnosis not present

## 2021-08-07 DIAGNOSIS — E7800 Pure hypercholesterolemia, unspecified: Secondary | ICD-10-CM | POA: Diagnosis not present

## 2021-08-07 DIAGNOSIS — E119 Type 2 diabetes mellitus without complications: Secondary | ICD-10-CM | POA: Diagnosis not present

## 2021-08-07 DIAGNOSIS — I10 Essential (primary) hypertension: Secondary | ICD-10-CM | POA: Diagnosis not present

## 2021-08-07 DIAGNOSIS — I77811 Abdominal aortic ectasia: Secondary | ICD-10-CM | POA: Diagnosis not present

## 2021-08-07 DIAGNOSIS — Z0001 Encounter for general adult medical examination with abnormal findings: Secondary | ICD-10-CM | POA: Diagnosis not present

## 2021-08-17 NOTE — Progress Notes (Signed)
Assessment/Plan:    1.  Essential Tremor  -Continue primidone, 50 mg, 3 tablets in the morning, 2 tablets at bed.  I am not sure increasing this is going to help anymore.  The right hand is fairly well controlled, but the left hand is not.  -Patient used to be on propranolol, but this was discontinued by primary care when he was started on Coreg.  -Discussed surgical interventions again for essential tremor.  He decided he wasn't interested  2.  Obstructive sleep apnea  -Patient on CPAP  3.  F/u 1 year  Subjective:   [place] was seen today in follow up for essential tremor.  My previous records were reviewed prior to todays visit.  He notes no worsening of tremor.  He continues to have difficulty with tremor on the left hand.  Last visit we discussed surgical interventions, but he was most interested in focused ultrasound.  I gave him some information on that last visit.  He has decided that he is not interested.  Current prescribed movement disorder medications:  Primidone, 50 mg, 3 tablets in the morning, 2 tablets at bed  PREVIOUS MEDICATIONS: Propranolol 80 mg twice per day (primary care discontinued when they added Coreg, 12.5 mg twice per day)  ALLERGIES:  No Known Allergies  CURRENT MEDICATIONS:  Outpatient Encounter Medications as of 08/21/2021  Medication Sig   allopurinol (ZYLOPRIM) 100 MG tablet Take 100 mg by mouth 2 (two) times daily.    amLODipine-atorvastatin (CADUET) 10-40 MG per tablet Take 1 tablet by mouth daily.   carvedilol (COREG) 12.5 MG tablet Take 1 tablet by mouth in the morning and at bedtime.   chlorthalidone (HYGROTON) 25 MG tablet    diclofenac Sodium (VOLTAREN) 1 % GEL As needed   doxazosin (CARDURA) 8 MG tablet Take 8 mg by mouth daily.   glipiZIDE (GLUCOTROL) 5 MG tablet Take 2.5 mg by mouth daily before breakfast.    lisinopril (PRINIVIL,ZESTRIL) 40 MG tablet Take 40 mg by mouth daily.   metFORMIN (GLUCOPHAGE) 1000 MG tablet Take 1,000  mg by mouth 2 (two) times daily with a meal.    Multiple Vitamin (MULTIVITAMIN WITH MINERALS) TABS tablet Take 1 tablet by mouth daily.   pioglitazone (ACTOS) 15 MG tablet Take 15 mg by mouth daily.   potassium chloride (MICRO-K) 10 MEQ CR capsule TK 1 C  PO QD   primidone (MYSOLINE) 50 MG tablet TAKE 3 TABLETS BY MOUTH EVERY MORNING AND 2 TABLETS AT NIGHT AS DIRECTED.   [DISCONTINUED] aspirin 81 MG tablet Take 81 mg by mouth daily. (Patient not taking: Reported on 08/21/2021)   [DISCONTINUED] furosemide (LASIX) 20 MG tablet Take 1 tablet by mouth daily. (Patient not taking: Reported on 08/21/2021)   No facility-administered encounter medications on file as of 08/21/2021.     Objective:    PHYSICAL EXAMINATION:    VITALS:   Vitals:   08/21/21 1119  BP: (!) 161/92  Pulse: 68  SpO2: 97%  Weight: 260 lb (117.9 kg)  Height: 5\' 9"  (1.753 m)     GEN:  The patient appears stated age and is in NAD. HEENT:  Normocephalic, atraumatic.  The mucous membranes are moist. The superficial temporal arteries are without ropiness or tenderness. CV:  RRR Lungs:  CTAB   Neurological examination:  Orientation: The patient is alert and oriented x3. Cranial nerves: There is good facial symmetry. The speech is fluent and clear. Soft palate rises symmetrically and there is no tongue deviation.  Hearing is intact to conversational tone. Sensation: Sensation is intact to light touch throughout Motor: Strength is at least antigravity x4.  Movement examination: Tone: There is normal tone in the UE/LE Abnormal movements: no rest tremor.  He has very little postural tremor on the right.  He has moderate  tremor on the left, but actually does fairly well on the right.  He has significant trouble with Archimedes spirals on the left  Coordination:  There is no decremation with RAM's  I have reviewed and interpreted the following labs independently   Chemistry      Component Value Date/Time   NA 141  06/05/2010 1220   K 4.1 06/05/2010 1220   CL 102 06/05/2010 1220   CO2 29 06/05/2010 1220   BUN 12 06/05/2010 1220   CREATININE 0.97 06/05/2010 1220      Component Value Date/Time   CALCIUM 9.4 06/05/2010 1220   ALKPHOS 55 06/05/2010 1220   AST 20 06/05/2010 1220   ALT 18 06/05/2010 1220   BILITOT 0.7 06/05/2010 1220      Lab Results  Component Value Date   WBC 6.6 06/05/2010   HGB 13.9 06/05/2010   HCT 42.2 06/05/2010   MCV 85.5 06/05/2010   PLT 205 06/05/2010   Lab Results  Component Value Date   TSH 0.982 11/30/2013     Chemistry      Component Value Date/Time   NA 141 06/05/2010 1220   K 4.1 06/05/2010 1220   CL 102 06/05/2010 1220   CO2 29 06/05/2010 1220   BUN 12 06/05/2010 1220   CREATININE 0.97 06/05/2010 1220      Component Value Date/Time   CALCIUM 9.4 06/05/2010 1220   ALKPHOS 55 06/05/2010 1220   AST 20 06/05/2010 1220   ALT 18 06/05/2010 1220   BILITOT 0.7 06/05/2010 1220       Cc:  Koirala, Dibas, MD

## 2021-08-21 ENCOUNTER — Ambulatory Visit: Payer: Medicare PPO | Admitting: Neurology

## 2021-08-21 ENCOUNTER — Other Ambulatory Visit: Payer: Self-pay

## 2021-08-21 VITALS — BP 161/92 | HR 68 | Ht 69.0 in | Wt 260.0 lb

## 2021-08-21 DIAGNOSIS — G250 Essential tremor: Secondary | ICD-10-CM

## 2021-08-28 DIAGNOSIS — I10 Essential (primary) hypertension: Secondary | ICD-10-CM | POA: Diagnosis not present

## 2021-08-30 DIAGNOSIS — E1142 Type 2 diabetes mellitus with diabetic polyneuropathy: Secondary | ICD-10-CM | POA: Diagnosis not present

## 2021-08-31 ENCOUNTER — Other Ambulatory Visit: Payer: Self-pay | Admitting: Neurology

## 2021-08-31 DIAGNOSIS — G250 Essential tremor: Secondary | ICD-10-CM

## 2021-09-03 ENCOUNTER — Other Ambulatory Visit: Payer: Self-pay

## 2021-09-05 ENCOUNTER — Other Ambulatory Visit: Payer: Self-pay | Admitting: Neurology

## 2021-09-05 DIAGNOSIS — G250 Essential tremor: Secondary | ICD-10-CM

## 2021-09-07 DIAGNOSIS — N400 Enlarged prostate without lower urinary tract symptoms: Secondary | ICD-10-CM | POA: Diagnosis not present

## 2021-09-07 DIAGNOSIS — I10 Essential (primary) hypertension: Secondary | ICD-10-CM | POA: Diagnosis not present

## 2021-09-07 DIAGNOSIS — E1169 Type 2 diabetes mellitus with other specified complication: Secondary | ICD-10-CM | POA: Diagnosis not present

## 2021-09-07 DIAGNOSIS — E7800 Pure hypercholesterolemia, unspecified: Secondary | ICD-10-CM | POA: Diagnosis not present

## 2021-11-06 DIAGNOSIS — E1165 Type 2 diabetes mellitus with hyperglycemia: Secondary | ICD-10-CM | POA: Diagnosis not present

## 2021-11-16 DIAGNOSIS — E119 Type 2 diabetes mellitus without complications: Secondary | ICD-10-CM | POA: Diagnosis not present

## 2021-11-16 DIAGNOSIS — I10 Essential (primary) hypertension: Secondary | ICD-10-CM | POA: Diagnosis not present

## 2021-11-16 DIAGNOSIS — E785 Hyperlipidemia, unspecified: Secondary | ICD-10-CM | POA: Diagnosis not present

## 2021-11-16 DIAGNOSIS — N400 Enlarged prostate without lower urinary tract symptoms: Secondary | ICD-10-CM | POA: Diagnosis not present

## 2021-11-29 DIAGNOSIS — I739 Peripheral vascular disease, unspecified: Secondary | ICD-10-CM | POA: Diagnosis not present

## 2021-11-29 DIAGNOSIS — E1151 Type 2 diabetes mellitus with diabetic peripheral angiopathy without gangrene: Secondary | ICD-10-CM | POA: Diagnosis not present

## 2021-11-29 DIAGNOSIS — L603 Nail dystrophy: Secondary | ICD-10-CM | POA: Diagnosis not present

## 2022-01-11 DIAGNOSIS — R0781 Pleurodynia: Secondary | ICD-10-CM | POA: Diagnosis not present

## 2022-02-05 DIAGNOSIS — K921 Melena: Secondary | ICD-10-CM | POA: Diagnosis not present

## 2022-02-05 DIAGNOSIS — E1169 Type 2 diabetes mellitus with other specified complication: Secondary | ICD-10-CM | POA: Diagnosis not present

## 2022-02-05 DIAGNOSIS — M109 Gout, unspecified: Secondary | ICD-10-CM | POA: Diagnosis not present

## 2022-02-05 DIAGNOSIS — E7800 Pure hypercholesterolemia, unspecified: Secondary | ICD-10-CM | POA: Diagnosis not present

## 2022-02-05 DIAGNOSIS — Z79899 Other long term (current) drug therapy: Secondary | ICD-10-CM | POA: Diagnosis not present

## 2022-02-05 DIAGNOSIS — I10 Essential (primary) hypertension: Secondary | ICD-10-CM | POA: Diagnosis not present

## 2022-02-05 DIAGNOSIS — N400 Enlarged prostate without lower urinary tract symptoms: Secondary | ICD-10-CM | POA: Diagnosis not present

## 2022-02-05 DIAGNOSIS — G250 Essential tremor: Secondary | ICD-10-CM | POA: Diagnosis not present

## 2022-02-19 DIAGNOSIS — I10 Essential (primary) hypertension: Secondary | ICD-10-CM | POA: Diagnosis not present

## 2022-02-19 DIAGNOSIS — N529 Male erectile dysfunction, unspecified: Secondary | ICD-10-CM | POA: Diagnosis not present

## 2022-02-19 DIAGNOSIS — M1990 Unspecified osteoarthritis, unspecified site: Secondary | ICD-10-CM | POA: Diagnosis not present

## 2022-02-19 DIAGNOSIS — G250 Essential tremor: Secondary | ICD-10-CM | POA: Diagnosis not present

## 2022-02-19 DIAGNOSIS — E785 Hyperlipidemia, unspecified: Secondary | ICD-10-CM | POA: Diagnosis not present

## 2022-02-19 DIAGNOSIS — E1136 Type 2 diabetes mellitus with diabetic cataract: Secondary | ICD-10-CM | POA: Diagnosis not present

## 2022-02-19 DIAGNOSIS — E1142 Type 2 diabetes mellitus with diabetic polyneuropathy: Secondary | ICD-10-CM | POA: Diagnosis not present

## 2022-02-19 DIAGNOSIS — M109 Gout, unspecified: Secondary | ICD-10-CM | POA: Diagnosis not present

## 2022-02-20 DIAGNOSIS — R194 Change in bowel habit: Secondary | ICD-10-CM | POA: Diagnosis not present

## 2022-02-20 DIAGNOSIS — K625 Hemorrhage of anus and rectum: Secondary | ICD-10-CM | POA: Diagnosis not present

## 2022-02-28 DIAGNOSIS — M722 Plantar fascial fibromatosis: Secondary | ICD-10-CM | POA: Diagnosis not present

## 2022-02-28 DIAGNOSIS — E1142 Type 2 diabetes mellitus with diabetic polyneuropathy: Secondary | ICD-10-CM | POA: Diagnosis not present

## 2022-02-28 DIAGNOSIS — I739 Peripheral vascular disease, unspecified: Secondary | ICD-10-CM | POA: Diagnosis not present

## 2022-03-07 DIAGNOSIS — M71571 Other bursitis, not elsewhere classified, right ankle and foot: Secondary | ICD-10-CM | POA: Diagnosis not present

## 2022-03-07 DIAGNOSIS — M722 Plantar fascial fibromatosis: Secondary | ICD-10-CM | POA: Diagnosis not present

## 2022-03-07 DIAGNOSIS — M7731 Calcaneal spur, right foot: Secondary | ICD-10-CM | POA: Diagnosis not present

## 2022-03-07 DIAGNOSIS — M25571 Pain in right ankle and joints of right foot: Secondary | ICD-10-CM | POA: Diagnosis not present

## 2022-03-14 ENCOUNTER — Telehealth: Payer: Self-pay | Admitting: Neurology

## 2022-03-14 NOTE — Telephone Encounter (Signed)
Pt called in wanting for someone to give him a call to give him his diagnosis.

## 2022-03-14 NOTE — Telephone Encounter (Signed)
Called pateint and gave information that he needed

## 2022-03-21 DIAGNOSIS — K5730 Diverticulosis of large intestine without perforation or abscess without bleeding: Secondary | ICD-10-CM | POA: Diagnosis not present

## 2022-03-21 DIAGNOSIS — R197 Diarrhea, unspecified: Secondary | ICD-10-CM | POA: Diagnosis not present

## 2022-03-21 DIAGNOSIS — D120 Benign neoplasm of cecum: Secondary | ICD-10-CM | POA: Diagnosis not present

## 2022-03-21 DIAGNOSIS — K648 Other hemorrhoids: Secondary | ICD-10-CM | POA: Diagnosis not present

## 2022-03-21 DIAGNOSIS — R194 Change in bowel habit: Secondary | ICD-10-CM | POA: Diagnosis not present

## 2022-03-21 DIAGNOSIS — K625 Hemorrhage of anus and rectum: Secondary | ICD-10-CM | POA: Diagnosis not present

## 2022-03-25 DIAGNOSIS — D120 Benign neoplasm of cecum: Secondary | ICD-10-CM | POA: Diagnosis not present

## 2022-03-26 DIAGNOSIS — M71571 Other bursitis, not elsewhere classified, right ankle and foot: Secondary | ICD-10-CM | POA: Diagnosis not present

## 2022-03-26 DIAGNOSIS — M722 Plantar fascial fibromatosis: Secondary | ICD-10-CM | POA: Diagnosis not present

## 2022-03-27 DIAGNOSIS — E785 Hyperlipidemia, unspecified: Secondary | ICD-10-CM | POA: Diagnosis not present

## 2022-03-27 DIAGNOSIS — E119 Type 2 diabetes mellitus without complications: Secondary | ICD-10-CM | POA: Diagnosis not present

## 2022-03-27 DIAGNOSIS — N400 Enlarged prostate without lower urinary tract symptoms: Secondary | ICD-10-CM | POA: Diagnosis not present

## 2022-03-27 DIAGNOSIS — I10 Essential (primary) hypertension: Secondary | ICD-10-CM | POA: Diagnosis not present

## 2022-04-18 DIAGNOSIS — R195 Other fecal abnormalities: Secondary | ICD-10-CM | POA: Diagnosis not present

## 2022-04-19 DIAGNOSIS — Z23 Encounter for immunization: Secondary | ICD-10-CM | POA: Diagnosis not present

## 2022-05-30 DIAGNOSIS — L603 Nail dystrophy: Secondary | ICD-10-CM | POA: Diagnosis not present

## 2022-05-30 DIAGNOSIS — E1142 Type 2 diabetes mellitus with diabetic polyneuropathy: Secondary | ICD-10-CM | POA: Diagnosis not present

## 2022-05-30 DIAGNOSIS — I739 Peripheral vascular disease, unspecified: Secondary | ICD-10-CM | POA: Diagnosis not present

## 2022-06-13 ENCOUNTER — Other Ambulatory Visit: Payer: Self-pay | Admitting: Neurology

## 2022-06-13 DIAGNOSIS — G250 Essential tremor: Secondary | ICD-10-CM

## 2022-06-20 NOTE — Progress Notes (Signed)
Assessment/Plan:    1.  Essential Tremor  -Continue primidone, 50 mg, 3 tablets in the morning, 2 tablets at bed.  I am not sure increasing this is going to help anymore.  The right hand is fairly well controlled, but the left hand is not.  -Patient used to be on propranolol, but this was discontinued by primary care when he was started on Coreg.  -Discussed surgical interventions again for essential tremor.  We have discussed these last few visits.  Only thing that has changed is that he could do focused ultrasound more local.  He doesn't think he is interested.  2.  Obstructive sleep apnea  -Patient on CPAP  3.  F/u 1 year  Subjective:   [place] was seen today in follow up for essential tremor.  My previous records were reviewed prior to todays visit.  He notes no worsening of tremor.  Patient continues to have some tremor and it may be a little worse but not a lot.  He is not interested in surgical interventions, including focused ultrasound.  He remains on primidone, without side effects.  Current prescribed movement disorder medications:  Primidone, 50 mg, 3 tablets in the morning, 2 tablets at bed  PREVIOUS MEDICATIONS: Propranolol 80 mg twice per day (primary care discontinued when they added Coreg, 12.5 mg twice per day)  ALLERGIES:  No Known Allergies  CURRENT MEDICATIONS:  Outpatient Encounter Medications as of 06/24/2022  Medication Sig   allopurinol (ZYLOPRIM) 100 MG tablet Take 100 mg by mouth 2 (two) times daily.    amLODipine-atorvastatin (CADUET) 10-40 MG per tablet Take 1 tablet by mouth daily.   carvedilol (COREG) 12.5 MG tablet Take 1 tablet by mouth in the morning and at bedtime.   chlorthalidone (HYGROTON) 25 MG tablet    diclofenac Sodium (VOLTAREN) 1 % GEL As needed   doxazosin (CARDURA) 8 MG tablet Take 8 mg by mouth daily.   glipiZIDE (GLUCOTROL) 5 MG tablet Take 2.5 mg by mouth daily before breakfast.    lisinopril (PRINIVIL,ZESTRIL) 40 MG  tablet Take 40 mg by mouth daily.   metFORMIN (GLUCOPHAGE) 1000 MG tablet Take 1,000 mg by mouth 2 (two) times daily with a meal.    Multiple Vitamin (MULTIVITAMIN WITH MINERALS) TABS tablet Take 1 tablet by mouth daily.   pioglitazone (ACTOS) 15 MG tablet Take 15 mg by mouth daily.   potassium chloride (MICRO-K) 10 MEQ CR capsule TK 1 C  PO QD   primidone (MYSOLINE) 50 MG tablet TAKE 3 TABLETS BY MOUTH EVERY MORNING AND 2 TABLETS AT NIGHT AS DIRECTED   No facility-administered encounter medications on file as of 06/24/2022.     Objective:    PHYSICAL EXAMINATION:    VITALS:   Vitals:   06/24/22 0833  BP: 122/80  Pulse: 82  SpO2: 97%  Weight: 257 lb (116.6 kg)  Height: 5\' 9"  (1.753 m)      GEN:  The patient appears stated age and is in NAD. HEENT:  Normocephalic, atraumatic.  The mucous membranes are moist. The superficial temporal arteries are without ropiness or tenderness.    Neurological examination:  Orientation: The patient is alert and oriented x3. Cranial nerves: There is good facial symmetry. The speech is fluent and clear. Soft palate rises symmetrically and there is no tongue deviation. Hearing is intact to conversational tone. Sensation: Sensation is intact to light touch throughout Motor: Strength is at least antigravity x4.  Movement examination: Tone: There is normal tone  in the UE/LE Abnormal movements: no rest tremor.  He has very little postural tremor on the right.  He has moderate  tremor on the left, but actually does fairly well on the right.  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  This is stable from 1 year ago Coordination:  There is no decremation with RAM's  I have reviewed and interpreted the following labs independently   Chemistry      Component Value Date/Time   NA 141 06/05/2010 1220   K 4.1 06/05/2010 1220   CL 102 06/05/2010 1220   CO2 29 06/05/2010 1220   BUN 12 06/05/2010 1220   CREATININE 0.97  06/05/2010 1220      Component Value Date/Time   CALCIUM 9.4 06/05/2010 1220   ALKPHOS 55 06/05/2010 1220   AST 20 06/05/2010 1220   ALT 18 06/05/2010 1220   BILITOT 0.7 06/05/2010 1220      Lab Results  Component Value Date   WBC 6.6 06/05/2010   HGB 13.9 06/05/2010   HCT 42.2 06/05/2010   MCV 85.5 06/05/2010   PLT 205 06/05/2010   Lab Results  Component Value Date   TSH 0.982 11/30/2013     Chemistry      Component Value Date/Time   NA 141 06/05/2010 1220   K 4.1 06/05/2010 1220   CL 102 06/05/2010 1220   CO2 29 06/05/2010 1220   BUN 12 06/05/2010 1220   CREATININE 0.97 06/05/2010 1220      Component Value Date/Time   CALCIUM 9.4 06/05/2010 1220   ALKPHOS 55 06/05/2010 1220   AST 20 06/05/2010 1220   ALT 18 06/05/2010 1220   BILITOT 0.7 06/05/2010 1220       Cc:  Koirala, Dibas, MD

## 2022-06-24 ENCOUNTER — Ambulatory Visit: Payer: Medicare PPO | Admitting: Neurology

## 2022-06-24 ENCOUNTER — Encounter: Payer: Self-pay | Admitting: Neurology

## 2022-06-24 DIAGNOSIS — G250 Essential tremor: Secondary | ICD-10-CM

## 2022-06-24 NOTE — Patient Instructions (Signed)
Essential Tremor ?A tremor is trembling or shaking that a person cannot control. Most tremors affect the hands or arms. Tremors can also affect the head, vocal cords, legs, and other parts of the body. Essential tremor is a tremor without a known cause. Usually, it occurs while a person is trying to perform an action. It tends to get worse gradually as a person ages. ?What are the causes? ?The cause of this condition is not known, but it often runs in families. ?What increases the risk? ?You are more likely to develop this condition if: ?You have a family member with essential tremor. ?You are 40 years of age or older. ?What are the signs or symptoms? ?The main sign of a tremor is a rhythmic shaking of certain parts of your body that is uncontrolled and unintentional. You may: ?Have difficulty eating with a spoon or fork. ?Have difficulty writing. ?Nod your head up and down or side to side. ?Have a quivering voice. ?The shaking may: ?Get worse over time. ?Come and go. ?Be more noticeable on one side of your body. ?Get worse due to stress, tiredness (fatigue), caffeine, and extreme heat or cold. ?How is this diagnosed? ?This condition may be diagnosed based on: ?Your symptoms and medical history. ?A physical exam. ?There is no single test to diagnose an essential tremor. However, your health care provider may order tests to rule out other causes of your condition. These may include: ?Blood and urine tests. ?Imaging studies of your brain, such as a CT scan or MRI. ?How is this treated? ?Treatment for essential tremor depends on the severity of the condition. ?Mild tremors may not need treatment if they do not affect your day-to-day life. ?Severe tremors may need to be treated using one or more of the following options: ?Medicines. ?Injections of a substance called botulinum toxin. ?Procedures such as deep brain stimulation (DBS) implantation or MRI-guided ultrasound treatment. ?Lifestyle changes. ?Occupational or  physical therapy. ?Follow these instructions at home: ?Lifestyle ? ?Do not use any products that contain nicotine or tobacco. These products include cigarettes, chewing tobacco, and vaping devices, such as e-cigarettes. If you need help quitting, ask your health care provider. ?Limit your caffeine intake as told by your health care provider. ?Try to get 8 hours of sleep each night. ?Find ways to manage your stress that fit your lifestyle and personality. Consider trying meditation or yoga. ?Try to anticipate stressful situations and allow extra time to manage them. ?If you are struggling emotionally with the effects of your tremor, consider working with a mental health provider. ?General instructions ?Take over-the-counter and prescription medicines only as told by your health care provider. ?Avoid extreme heat and extreme cold. ?Keep all follow-up visits. This is important. Visits may include physical therapy visits. ?Where to find more information ?National Institute of Neurological Disorders and Stroke: www.ninds.nih.gov ?Contact a health care provider if: ?You experience any changes in the location or intensity of your tremors. ?You start having a tremor after starting a new medicine. ?You have a tremor with other symptoms, such as: ?Numbness. ?Tingling. ?Pain. ?Weakness. ?Your tremor gets worse. ?Your tremor interferes with your daily life. ?You feel down, blue, or sad for at least 2 weeks in a row. ?Worrying about your tremor and what other people think about you interferes with your everyday life functions, including relationships, work, or school. ?Summary ?Essential tremor is a tremor without a known cause. Usually, it occurs when you are trying to perform an action. ?You are more likely   to develop this condition if you have a family member with essential tremor. ?The main sign of a tremor is a rhythmic shaking of certain parts of your body that is uncontrolled and unintentional. ?Treatment for essential  tremor depends on the severity of the condition. ?This information is not intended to replace advice given to you by your health care provider. Make sure you discuss any questions you have with your health care provider. ?Document Revised: 04/20/2021 Document Reviewed: 04/20/2021 ?Elsevier Patient Education ? 2023 Elsevier Inc. ? ?

## 2022-07-23 DIAGNOSIS — R198 Other specified symptoms and signs involving the digestive system and abdomen: Secondary | ICD-10-CM | POA: Diagnosis not present

## 2022-08-02 DIAGNOSIS — G4733 Obstructive sleep apnea (adult) (pediatric): Secondary | ICD-10-CM | POA: Diagnosis not present

## 2022-08-07 DIAGNOSIS — E785 Hyperlipidemia, unspecified: Secondary | ICD-10-CM | POA: Diagnosis not present

## 2022-08-07 DIAGNOSIS — N400 Enlarged prostate without lower urinary tract symptoms: Secondary | ICD-10-CM | POA: Diagnosis not present

## 2022-08-07 DIAGNOSIS — I10 Essential (primary) hypertension: Secondary | ICD-10-CM | POA: Diagnosis not present

## 2022-08-07 DIAGNOSIS — E1169 Type 2 diabetes mellitus with other specified complication: Secondary | ICD-10-CM | POA: Diagnosis not present

## 2022-08-16 DIAGNOSIS — E1165 Type 2 diabetes mellitus with hyperglycemia: Secondary | ICD-10-CM | POA: Diagnosis not present

## 2022-08-16 DIAGNOSIS — Z79899 Other long term (current) drug therapy: Secondary | ICD-10-CM | POA: Diagnosis not present

## 2022-08-16 DIAGNOSIS — Z0001 Encounter for general adult medical examination with abnormal findings: Secondary | ICD-10-CM | POA: Diagnosis not present

## 2022-08-16 DIAGNOSIS — E1169 Type 2 diabetes mellitus with other specified complication: Secondary | ICD-10-CM | POA: Diagnosis not present

## 2022-08-16 DIAGNOSIS — Z125 Encounter for screening for malignant neoplasm of prostate: Secondary | ICD-10-CM | POA: Diagnosis not present

## 2022-08-16 DIAGNOSIS — E7800 Pure hypercholesterolemia, unspecified: Secondary | ICD-10-CM | POA: Diagnosis not present

## 2022-08-16 DIAGNOSIS — E119 Type 2 diabetes mellitus without complications: Secondary | ICD-10-CM | POA: Diagnosis not present

## 2022-08-16 DIAGNOSIS — I77811 Abdominal aortic ectasia: Secondary | ICD-10-CM | POA: Diagnosis not present

## 2022-08-16 DIAGNOSIS — Z1331 Encounter for screening for depression: Secondary | ICD-10-CM | POA: Diagnosis not present

## 2022-08-16 DIAGNOSIS — R200 Anesthesia of skin: Secondary | ICD-10-CM | POA: Diagnosis not present

## 2022-08-20 DIAGNOSIS — E119 Type 2 diabetes mellitus without complications: Secondary | ICD-10-CM | POA: Diagnosis not present

## 2022-08-26 DIAGNOSIS — I739 Peripheral vascular disease, unspecified: Secondary | ICD-10-CM | POA: Diagnosis not present

## 2022-08-26 DIAGNOSIS — L603 Nail dystrophy: Secondary | ICD-10-CM | POA: Diagnosis not present

## 2022-08-26 DIAGNOSIS — E1142 Type 2 diabetes mellitus with diabetic polyneuropathy: Secondary | ICD-10-CM | POA: Diagnosis not present

## 2022-08-26 DIAGNOSIS — E1151 Type 2 diabetes mellitus with diabetic peripheral angiopathy without gangrene: Secondary | ICD-10-CM | POA: Diagnosis not present

## 2022-09-14 ENCOUNTER — Other Ambulatory Visit: Payer: Self-pay | Admitting: Neurology

## 2022-09-14 DIAGNOSIS — G250 Essential tremor: Secondary | ICD-10-CM

## 2022-11-25 DIAGNOSIS — E1142 Type 2 diabetes mellitus with diabetic polyneuropathy: Secondary | ICD-10-CM | POA: Diagnosis not present

## 2022-11-25 DIAGNOSIS — E1151 Type 2 diabetes mellitus with diabetic peripheral angiopathy without gangrene: Secondary | ICD-10-CM | POA: Diagnosis not present

## 2022-11-25 DIAGNOSIS — L603 Nail dystrophy: Secondary | ICD-10-CM | POA: Diagnosis not present

## 2022-11-25 DIAGNOSIS — I739 Peripheral vascular disease, unspecified: Secondary | ICD-10-CM | POA: Diagnosis not present

## 2022-12-15 ENCOUNTER — Other Ambulatory Visit: Payer: Self-pay | Admitting: Neurology

## 2022-12-15 DIAGNOSIS — G250 Essential tremor: Secondary | ICD-10-CM

## 2023-01-23 DIAGNOSIS — N400 Enlarged prostate without lower urinary tract symptoms: Secondary | ICD-10-CM | POA: Diagnosis not present

## 2023-01-23 DIAGNOSIS — G4733 Obstructive sleep apnea (adult) (pediatric): Secondary | ICD-10-CM | POA: Diagnosis not present

## 2023-01-23 DIAGNOSIS — M1990 Unspecified osteoarthritis, unspecified site: Secondary | ICD-10-CM | POA: Diagnosis not present

## 2023-01-23 DIAGNOSIS — M109 Gout, unspecified: Secondary | ICD-10-CM | POA: Diagnosis not present

## 2023-01-23 DIAGNOSIS — I10 Essential (primary) hypertension: Secondary | ICD-10-CM | POA: Diagnosis not present

## 2023-01-23 DIAGNOSIS — E785 Hyperlipidemia, unspecified: Secondary | ICD-10-CM | POA: Diagnosis not present

## 2023-01-23 DIAGNOSIS — G250 Essential tremor: Secondary | ICD-10-CM | POA: Diagnosis not present

## 2023-01-23 DIAGNOSIS — N529 Male erectile dysfunction, unspecified: Secondary | ICD-10-CM | POA: Diagnosis not present

## 2023-02-14 DIAGNOSIS — Z79899 Other long term (current) drug therapy: Secondary | ICD-10-CM | POA: Diagnosis not present

## 2023-02-14 DIAGNOSIS — E7800 Pure hypercholesterolemia, unspecified: Secondary | ICD-10-CM | POA: Diagnosis not present

## 2023-02-14 DIAGNOSIS — E1169 Type 2 diabetes mellitus with other specified complication: Secondary | ICD-10-CM | POA: Diagnosis not present

## 2023-02-14 DIAGNOSIS — I77819 Aortic ectasia, unspecified site: Secondary | ICD-10-CM | POA: Diagnosis not present

## 2023-02-14 DIAGNOSIS — E119 Type 2 diabetes mellitus without complications: Secondary | ICD-10-CM | POA: Diagnosis not present

## 2023-02-14 DIAGNOSIS — Z9989 Dependence on other enabling machines and devices: Secondary | ICD-10-CM | POA: Diagnosis not present

## 2023-02-14 DIAGNOSIS — Z6836 Body mass index (BMI) 36.0-36.9, adult: Secondary | ICD-10-CM | POA: Diagnosis not present

## 2023-02-25 DIAGNOSIS — E1151 Type 2 diabetes mellitus with diabetic peripheral angiopathy without gangrene: Secondary | ICD-10-CM | POA: Diagnosis not present

## 2023-02-25 DIAGNOSIS — L603 Nail dystrophy: Secondary | ICD-10-CM | POA: Diagnosis not present

## 2023-02-25 DIAGNOSIS — E1142 Type 2 diabetes mellitus with diabetic polyneuropathy: Secondary | ICD-10-CM | POA: Diagnosis not present

## 2023-02-25 DIAGNOSIS — I739 Peripheral vascular disease, unspecified: Secondary | ICD-10-CM | POA: Diagnosis not present

## 2023-03-15 ENCOUNTER — Other Ambulatory Visit: Payer: Self-pay | Admitting: Neurology

## 2023-03-15 DIAGNOSIS — G250 Essential tremor: Secondary | ICD-10-CM

## 2023-05-27 DIAGNOSIS — E1151 Type 2 diabetes mellitus with diabetic peripheral angiopathy without gangrene: Secondary | ICD-10-CM | POA: Diagnosis not present

## 2023-05-27 DIAGNOSIS — L603 Nail dystrophy: Secondary | ICD-10-CM | POA: Diagnosis not present

## 2023-05-27 DIAGNOSIS — I739 Peripheral vascular disease, unspecified: Secondary | ICD-10-CM | POA: Diagnosis not present

## 2023-05-27 DIAGNOSIS — L84 Corns and callosities: Secondary | ICD-10-CM | POA: Diagnosis not present

## 2023-05-27 DIAGNOSIS — E1142 Type 2 diabetes mellitus with diabetic polyneuropathy: Secondary | ICD-10-CM | POA: Diagnosis not present

## 2023-06-15 ENCOUNTER — Other Ambulatory Visit: Payer: Self-pay | Admitting: Neurology

## 2023-06-15 DIAGNOSIS — G250 Essential tremor: Secondary | ICD-10-CM

## 2023-06-22 NOTE — Progress Notes (Unsigned)
Assessment/Plan:    1.  Essential Tremor  -Continue primidone, 50 mg, 3 tablets in the morning, 2 tablets at bed.  I am not sure increasing this is going to help anymore.  The right hand is fairly well controlled, but the left hand is not.  -Patient used to be on propranolol, but this was discontinued by primary care when he was started on Coreg.  -Discussed surgical interventions again for essential tremor.  We have discussed these last few visits.  Only thing that has changed is that he could do focused ultrasound more local.  He doesn't think he is interested.  2.  Obstructive sleep apnea  -Patient on CPAP  3.  F/u 1 year  Subjective:   Gian Mumper was seen today in follow up for essential tremor.  My previous records were reviewed prior to todays visit.  He was last seen about a year ago.  Still on primidone faithfully.  Still some tremor that waxes and wanes.  He is not interested in surgeries for this.     Current prescribed movement disorder medications:  Primidone, 50 mg, 3 tablets in the morning, 2 tablets at bed  PREVIOUS MEDICATIONS: Propranolol 80 mg twice per day (primary care discontinued when they added Coreg, 12.5 mg twice per day)  ALLERGIES:  No Known Allergies  CURRENT MEDICATIONS:  Outpatient Encounter Medications as of 06/24/2023  Medication Sig   allopurinol (ZYLOPRIM) 100 MG tablet Take 100 mg by mouth 2 (two) times daily.    amLODipine-atorvastatin (CADUET) 10-40 MG per tablet Take 1 tablet by mouth daily.   carvedilol (COREG) 12.5 MG tablet Take 1 tablet by mouth in the morning and at bedtime.   chlorthalidone (HYGROTON) 25 MG tablet    diclofenac Sodium (VOLTAREN) 1 % GEL As needed   doxazosin (CARDURA) 8 MG tablet Take 8 mg by mouth daily.   glipiZIDE (GLUCOTROL) 5 MG tablet Take 2.5 mg by mouth daily before breakfast.    lisinopril (PRINIVIL,ZESTRIL) 40 MG tablet Take 40 mg by mouth daily.   metFORMIN (GLUCOPHAGE) 1000 MG tablet Take 1,000 mg by  mouth 2 (two) times daily with a meal.    Multiple Vitamin (MULTIVITAMIN WITH MINERALS) TABS tablet Take 1 tablet by mouth daily.   pioglitazone (ACTOS) 15 MG tablet Take 15 mg by mouth daily.   potassium chloride (MICRO-K) 10 MEQ CR capsule TK 1 C  PO QD   primidone (MYSOLINE) 50 MG tablet TAKE 3 TABLETS BY MOUTH EVERY MORNING AND 2 TABLETS AT NIGHT AS DIRECTED   No facility-administered encounter medications on file as of 06/24/2023.     Objective:    PHYSICAL EXAMINATION:    VITALS:   There were no vitals filed for this visit.     GEN:  The patient appears stated age and is in NAD. HEENT:  Normocephalic, atraumatic.  The mucous membranes are moist. The superficial temporal arteries are without ropiness or tenderness.    Neurological examination:  Orientation: The patient is alert and oriented x3. Cranial nerves: There is good facial symmetry. The speech is fluent and clear. Soft palate rises symmetrically and there is no tongue deviation. Hearing is intact to conversational tone. Sensation: Sensation is intact to light touch throughout Motor: Strength is at least antigravity x4.  Movement examination: Tone: There is normal tone in the UE/LE Abnormal movements: no rest tremor.  He has very little postural tremor on the right.  He has moderate  tremor on the left, but actually  does fairly well on the right.  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  This is stable from 1 year ago Coordination:  There is no decremation with RAM's  I have reviewed and interpreted the following labs independently   Chemistry      Component Value Date/Time   NA 141 06/05/2010 1220   K 4.1 06/05/2010 1220   CL 102 06/05/2010 1220   CO2 29 06/05/2010 1220   BUN 12 06/05/2010 1220   CREATININE 0.97 06/05/2010 1220      Component Value Date/Time   CALCIUM 9.4 06/05/2010 1220   ALKPHOS 55 06/05/2010 1220   AST 20 06/05/2010 1220   ALT 18 06/05/2010 1220   BILITOT  0.7 06/05/2010 1220      Lab Results  Component Value Date   WBC 6.6 06/05/2010   HGB 13.9 06/05/2010   HCT 42.2 06/05/2010   MCV 85.5 06/05/2010   PLT 205 06/05/2010   Lab Results  Component Value Date   TSH 0.982 11/30/2013     Chemistry      Component Value Date/Time   NA 141 06/05/2010 1220   K 4.1 06/05/2010 1220   CL 102 06/05/2010 1220   CO2 29 06/05/2010 1220   BUN 12 06/05/2010 1220   CREATININE 0.97 06/05/2010 1220      Component Value Date/Time   CALCIUM 9.4 06/05/2010 1220   ALKPHOS 55 06/05/2010 1220   AST 20 06/05/2010 1220   ALT 18 06/05/2010 1220   BILITOT 0.7 06/05/2010 1220     Total time spent on today's visit was *** minutes, including both face-to-face time and nonface-to-face time.  Time included that spent on review of records (prior notes available to me/labs/imaging if pertinent), discussing treatment and goals, answering patient's questions and coordinating care.   Cc:  Darrow Bussing, MD

## 2023-06-24 ENCOUNTER — Ambulatory Visit: Payer: Medicare PPO | Admitting: Neurology

## 2023-06-24 ENCOUNTER — Encounter: Payer: Self-pay | Admitting: Neurology

## 2023-06-24 VITALS — BP 124/82 | HR 72 | Ht 69.0 in | Wt 243.0 lb

## 2023-06-24 DIAGNOSIS — G250 Essential tremor: Secondary | ICD-10-CM

## 2023-07-03 ENCOUNTER — Telehealth: Payer: Self-pay

## 2023-07-03 NOTE — Telephone Encounter (Signed)
Patient called and let me know he had received a call from [facility] regarding his device. They made him sign another form and also made him resend his insurance. They told patient he had to have a follow up scheduled for March or they would not send his device. I was able to get patient in for a follow up for [facility]

## 2023-07-03 NOTE — Telephone Encounter (Signed)
Error

## 2023-07-08 ENCOUNTER — Telehealth: Payer: Self-pay

## 2023-07-08 NOTE — Telephone Encounter (Signed)
[employer] reaching out for peer to peer for [facility] Tremor scale was completed on this patient and I made an appointment in March for him to be reevaluated

## 2023-07-11 NOTE — Telephone Encounter (Signed)
Unable to locate [employer] paper work to get peer to peer scheduled

## 2023-07-14 NOTE — Telephone Encounter (Signed)
Called [facility] - Steward Drone, Clinical Nurse at [phone]. Left a message for a call back in regards to arranging a peer to peer with Dr. Arbutus Leas. Left our contact information for a return call.

## 2023-07-14 NOTE — Telephone Encounter (Signed)
Steward Drone returned call and informed me that the time frame has expired and we are unable to do a peer to peer. Steward Drone informed me that a letter is coming in the mail, once letter is received we can do the grievance/appeal.

## 2023-07-14 NOTE — Telephone Encounter (Signed)
Called patient and left a detailed message per DPR about status of [facility] per above. Left our contact information incase there are any questions or concerns.

## 2023-07-14 NOTE — Telephone Encounter (Signed)
It should be noted that [employer] sent need for peer to peer on 12/24 and noted that we could do one until 12/27.  Our office was closed for Christmas the afternoon of 12/24 and 12/25 and I was out of the office on 12/26 and 12/27.  I called the first day back on 12/30 which was 4 business days after receiving the letter (and this includes Christmas day as a business day).  They declined peer to peer and said that they would be issuing a denial.  Please let pt know the details.

## 2023-08-19 DIAGNOSIS — Z79899 Other long term (current) drug therapy: Secondary | ICD-10-CM | POA: Diagnosis not present

## 2023-08-19 DIAGNOSIS — E1169 Type 2 diabetes mellitus with other specified complication: Secondary | ICD-10-CM | POA: Diagnosis not present

## 2023-08-19 DIAGNOSIS — E7800 Pure hypercholesterolemia, unspecified: Secondary | ICD-10-CM | POA: Diagnosis not present

## 2023-08-19 DIAGNOSIS — Z0001 Encounter for general adult medical examination with abnormal findings: Secondary | ICD-10-CM | POA: Diagnosis not present

## 2023-08-19 DIAGNOSIS — G250 Essential tremor: Secondary | ICD-10-CM | POA: Diagnosis not present

## 2023-08-19 DIAGNOSIS — G4733 Obstructive sleep apnea (adult) (pediatric): Secondary | ICD-10-CM | POA: Diagnosis not present

## 2023-08-19 DIAGNOSIS — Z1331 Encounter for screening for depression: Secondary | ICD-10-CM | POA: Diagnosis not present

## 2023-08-26 DIAGNOSIS — E1151 Type 2 diabetes mellitus with diabetic peripheral angiopathy without gangrene: Secondary | ICD-10-CM | POA: Diagnosis not present

## 2023-08-26 DIAGNOSIS — L84 Corns and callosities: Secondary | ICD-10-CM | POA: Diagnosis not present

## 2023-08-26 DIAGNOSIS — I739 Peripheral vascular disease, unspecified: Secondary | ICD-10-CM | POA: Diagnosis not present

## 2023-08-26 DIAGNOSIS — E1142 Type 2 diabetes mellitus with diabetic polyneuropathy: Secondary | ICD-10-CM | POA: Diagnosis not present

## 2023-09-11 DIAGNOSIS — E669 Obesity, unspecified: Secondary | ICD-10-CM | POA: Diagnosis not present

## 2023-09-11 DIAGNOSIS — G4733 Obstructive sleep apnea (adult) (pediatric): Secondary | ICD-10-CM | POA: Diagnosis not present

## 2023-09-14 ENCOUNTER — Other Ambulatory Visit: Payer: Self-pay | Admitting: Neurology

## 2023-09-14 DIAGNOSIS — G250 Essential tremor: Secondary | ICD-10-CM

## 2023-09-18 DIAGNOSIS — G250 Essential tremor: Secondary | ICD-10-CM | POA: Diagnosis not present

## 2023-09-18 DIAGNOSIS — Z1330 Encounter for screening examination for mental health and behavioral disorders, unspecified: Secondary | ICD-10-CM | POA: Diagnosis not present

## 2023-09-22 NOTE — Progress Notes (Unsigned)
 Assessment/Plan:    1.  Essential Tremor  -Continue primidone, 50 mg, 3 tablets in the morning, 2 tablets at bed.   -Patient used to be on propranolol, but this was discontinued by primary care when he was started on Coreg.  -Patient now plans to undergo focused ultrasound in [place].  We discussed risks and benefits of this extensively.  He has talked this over with the surgeon in [place] as well.  He plans to do the right hand first.  The left hand is much worse, but he is right-hand dominant.  He and I did discuss that he is not to stop the primidone cold [place], but is to be weaned off of it after surgery if he wants.  He knows that he cannot have both hands done through focused ultrasound at the same time and would need at least 9 months between the 2.  -[facility] was declined by insurance.  Even so, now that I have seen the few patients have it, I have not been impressed with the technology of the device, as it has been failing.  -pt certainly could try gabapentin but doubtful it will be impactful for the degree of tremor that he has  2.  Obstructive sleep apnea  -Patient on CPAP  3.  He will call me after focused ultrasound is completed and we will discuss how to wean the primidone, if appropriate.  He may end up staying on it for a while for the opposite hand.  Subjective:   Evan Willis was seen today in follow up for essential tremor.  My previous records were reviewed prior to todays visit.  He was last seen about a year ago.  Still on primidone faithfully.  Still some tremor that waxes and wanes.  He has not wanted to consider surgical interventions.  We decided to consider [employer].  His insurance company sent a need for a peer to peer on December 24 and our office was closed the afternoon of December 24 and December 25.  They wanted the peer to peer by December 27 and I was out of the office December 26 and 27.  I called my first day back on December 30 (which was 4  business days after receiving the letter, including Christmas Day), and the request for peer to peer was declined.  He went to [facility] on 3/6 for consult for focused ultrasound.  I reviewed the notes.  They are considering proceeding with focused ultrasound.  Notes from [place] indicate that the right hand is worse than the left, but that has never been the case for the patient (nor has it been my experience when I have seen the patient in the office here).  Current prescribed movement disorder medications:  Primidone, 50 mg, 3 tablets in the morning, 2 tablets at bed  PREVIOUS MEDICATIONS: Propranolol 80 mg twice per day (primary care discontinued when they added Coreg, 12.5 mg twice per day)  ALLERGIES:  No Known Allergies  CURRENT MEDICATIONS:  Outpatient Encounter Medications as of 09/23/2023  Medication Sig   allopurinol (ZYLOPRIM) 100 MG tablet Take 100 mg by mouth 2 (two) times daily.    amLODipine-atorvastatin (CADUET) 10-40 MG per tablet Take 1 tablet by mouth daily.   carvedilol (COREG) 12.5 MG tablet Take 1 tablet by mouth in the morning and at bedtime.   chlorthalidone (HYGROTON) 25 MG tablet    diclofenac Sodium (VOLTAREN) 1 % GEL As needed   doxazosin (CARDURA) 8 MG tablet  Take 8 mg by mouth daily.   glipiZIDE (GLUCOTROL) 5 MG tablet Take 2.5 mg by mouth daily before breakfast.    lisinopril (PRINIVIL,ZESTRIL) 40 MG tablet Take 40 mg by mouth daily.   metFORMIN (GLUCOPHAGE) 1000 MG tablet Take 1,000 mg by mouth 2 (two) times daily with a meal.    Multiple Vitamin (MULTIVITAMIN WITH MINERALS) TABS tablet Take 1 tablet by mouth daily.   pioglitazone (ACTOS) 15 MG tablet Take 15 mg by mouth daily.   potassium chloride (MICRO-K) 10 MEQ CR capsule TK 1 C  PO QD   primidone (MYSOLINE) 50 MG tablet TAKE 3 TABLETS BY MOUTH EVERY MORNING AND 2 TABLETS AT NIGHT AS DIRECTED   No facility-administered encounter medications on file as of 09/23/2023.     Objective:    PHYSICAL  EXAMINATION:    VITALS:   Vitals:   09/23/23 1449  BP: 124/86  Pulse: 68  SpO2: 98%  Weight: 246 lb (111.6 kg)  Height: 5\' 9"  (1.753 m)    Wt Readings from Last 3 Encounters:  09/23/23 246 lb (111.6 kg)  06/24/23 243 lb (110.2 kg)  06/24/22 257 lb (116.6 kg)    GEN:  The patient appears stated age and is in NAD. HEENT:  Normocephalic, atraumatic.  The mucous membranes are moist. The superficial temporal arteries are without ropiness or tenderness. Cardiovascular: Regular rate rhythm Lungs: Clear to auscultation bilaterally   Neurological examination:  Orientation: The patient is alert and oriented x3. Cranial nerves: There is good facial symmetry. The speech is fluent and clear. Soft palate rises symmetrically and there is no tongue deviation. Hearing is intact to conversational tone. Sensation: Sensation is intact to light touch throughout Motor: Strength is at least antigravity x4.  Movement examination: Tone: There is normal tone in the UE/LE Abnormal movements: no rest tremor.  He has very little postural tremor on the right.  Not much intention tremor on the right today either.  He has at least moderate intention tremor on the left.   Coordination:  There is no decremation with RAM's  I have reviewed and interpreted the following labs independently   Chemistry      Component Value Date/Time   NA 141 06/05/2010 1220   K 4.1 06/05/2010 1220   CL 102 06/05/2010 1220   CO2 29 06/05/2010 1220   BUN 12 06/05/2010 1220   CREATININE 0.97 06/05/2010 1220      Component Value Date/Time   CALCIUM 9.4 06/05/2010 1220   ALKPHOS 55 06/05/2010 1220   AST 20 06/05/2010 1220   ALT 18 06/05/2010 1220   BILITOT 0.7 06/05/2010 1220      Lab Results  Component Value Date   WBC 6.6 06/05/2010   HGB 13.9 06/05/2010   HCT 42.2 06/05/2010   MCV 85.5 06/05/2010   PLT 205 06/05/2010   Lab Results  Component Value Date   TSH 0.982 11/30/2013     Chemistry      Component  Value Date/Time   NA 141 06/05/2010 1220   K 4.1 06/05/2010 1220   CL 102 06/05/2010 1220   CO2 29 06/05/2010 1220   BUN 12 06/05/2010 1220   CREATININE 0.97 06/05/2010 1220      Component Value Date/Time   CALCIUM 9.4 06/05/2010 1220   ALKPHOS 55 06/05/2010 1220   AST 20 06/05/2010 1220   ALT 18 06/05/2010 1220   BILITOT 0.7 06/05/2010 1220     Total time spent on today's visit was 35  minutes, including both face-to-face time and nonface-to-face time.  Time included that spent on review of records (prior notes available to me/labs/imaging if pertinent), discussing treatment and goals, answering patient's questions and coordinating care.   Cc:  Darrow Bussing, MD

## 2023-09-23 ENCOUNTER — Ambulatory Visit: Payer: Medicare PPO | Admitting: Neurology

## 2023-09-23 VITALS — BP 124/86 | HR 68 | Ht 69.0 in | Wt 246.0 lb

## 2023-09-23 DIAGNOSIS — G250 Essential tremor: Secondary | ICD-10-CM

## 2023-09-26 DIAGNOSIS — G4733 Obstructive sleep apnea (adult) (pediatric): Secondary | ICD-10-CM | POA: Diagnosis not present

## 2023-10-08 DIAGNOSIS — R0902 Hypoxemia: Secondary | ICD-10-CM | POA: Diagnosis not present

## 2023-10-10 DIAGNOSIS — E1142 Type 2 diabetes mellitus with diabetic polyneuropathy: Secondary | ICD-10-CM | POA: Diagnosis not present

## 2023-10-10 DIAGNOSIS — E1151 Type 2 diabetes mellitus with diabetic peripheral angiopathy without gangrene: Secondary | ICD-10-CM | POA: Diagnosis not present

## 2023-10-10 DIAGNOSIS — F324 Major depressive disorder, single episode, in partial remission: Secondary | ICD-10-CM | POA: Diagnosis not present

## 2023-10-10 DIAGNOSIS — N529 Male erectile dysfunction, unspecified: Secondary | ICD-10-CM | POA: Diagnosis not present

## 2023-10-10 DIAGNOSIS — I10 Essential (primary) hypertension: Secondary | ICD-10-CM | POA: Diagnosis not present

## 2023-10-10 DIAGNOSIS — E11630 Type 2 diabetes mellitus with periodontal disease: Secondary | ICD-10-CM | POA: Diagnosis not present

## 2023-10-10 DIAGNOSIS — Z8249 Family history of ischemic heart disease and other diseases of the circulatory system: Secondary | ICD-10-CM | POA: Diagnosis not present

## 2023-10-10 DIAGNOSIS — E785 Hyperlipidemia, unspecified: Secondary | ICD-10-CM | POA: Diagnosis not present

## 2023-12-02 ENCOUNTER — Ambulatory Visit: Admitting: Podiatry

## 2023-12-02 ENCOUNTER — Encounter: Payer: Self-pay | Admitting: Podiatry

## 2023-12-02 DIAGNOSIS — I70209 Unspecified atherosclerosis of native arteries of extremities, unspecified extremity: Secondary | ICD-10-CM | POA: Diagnosis not present

## 2023-12-02 DIAGNOSIS — M79671 Pain in right foot: Secondary | ICD-10-CM | POA: Diagnosis not present

## 2023-12-02 DIAGNOSIS — E1151 Type 2 diabetes mellitus with diabetic peripheral angiopathy without gangrene: Secondary | ICD-10-CM

## 2023-12-02 DIAGNOSIS — B351 Tinea unguium: Secondary | ICD-10-CM

## 2023-12-02 DIAGNOSIS — M79672 Pain in left foot: Secondary | ICD-10-CM

## 2023-12-02 NOTE — Progress Notes (Signed)
 Patient presents for evaluation and treatment of tenderness and some redness around nails feet.  Tenderness around toes with walking and wearing shoes.  Physical exam:  General appearance: Alert, pleasant, and in no acute distress.  Vascular: Pedal pulses: DP palpable bilaterally, PT nonpalpable bilaterally.  Moderate edema lower legs bilaterally  Neurological:  Some paresthesias noted  Dermatologic:  Nails thickened, disfigured, discolored 1-5 BL with subungual debris.  Redness and hypertrophic nail folds along nail folds bilaterally but no signs of drainage or infection.  Musculoskeletal:     Diagnosis: 1. Painful onychomycotic nails 1 through 5 bilaterally. 2. Pain toes 1 through 5 bilaterally. 3. Type 2 diabetes mellitis with PVD  Plan: Debrided onychomycotic nails 1 through 5 bilaterally.  Return 3 months

## 2023-12-04 DIAGNOSIS — H35033 Hypertensive retinopathy, bilateral: Secondary | ICD-10-CM | POA: Diagnosis not present

## 2023-12-04 DIAGNOSIS — H2513 Age-related nuclear cataract, bilateral: Secondary | ICD-10-CM | POA: Diagnosis not present

## 2023-12-06 DIAGNOSIS — G4733 Obstructive sleep apnea (adult) (pediatric): Secondary | ICD-10-CM | POA: Diagnosis not present

## 2023-12-13 ENCOUNTER — Other Ambulatory Visit: Payer: Self-pay | Admitting: Neurology

## 2023-12-13 DIAGNOSIS — G250 Essential tremor: Secondary | ICD-10-CM

## 2023-12-30 DIAGNOSIS — G4733 Obstructive sleep apnea (adult) (pediatric): Secondary | ICD-10-CM | POA: Diagnosis not present

## 2024-01-29 DIAGNOSIS — G4733 Obstructive sleep apnea (adult) (pediatric): Secondary | ICD-10-CM | POA: Diagnosis not present

## 2024-02-17 DIAGNOSIS — G250 Essential tremor: Secondary | ICD-10-CM | POA: Diagnosis not present

## 2024-02-17 DIAGNOSIS — E7800 Pure hypercholesterolemia, unspecified: Secondary | ICD-10-CM | POA: Diagnosis not present

## 2024-02-17 DIAGNOSIS — I10 Essential (primary) hypertension: Secondary | ICD-10-CM | POA: Diagnosis not present

## 2024-02-17 DIAGNOSIS — E119 Type 2 diabetes mellitus without complications: Secondary | ICD-10-CM | POA: Diagnosis not present

## 2024-02-17 DIAGNOSIS — Z79899 Other long term (current) drug therapy: Secondary | ICD-10-CM | POA: Diagnosis not present

## 2024-02-29 DIAGNOSIS — G4733 Obstructive sleep apnea (adult) (pediatric): Secondary | ICD-10-CM | POA: Diagnosis not present

## 2024-03-02 ENCOUNTER — Ambulatory Visit (INDEPENDENT_AMBULATORY_CARE_PROVIDER_SITE_OTHER): Admitting: Podiatry

## 2024-03-02 DIAGNOSIS — I70209 Unspecified atherosclerosis of native arteries of extremities, unspecified extremity: Secondary | ICD-10-CM

## 2024-03-02 DIAGNOSIS — E1151 Type 2 diabetes mellitus with diabetic peripheral angiopathy without gangrene: Secondary | ICD-10-CM | POA: Diagnosis not present

## 2024-03-02 DIAGNOSIS — M79672 Pain in left foot: Secondary | ICD-10-CM | POA: Diagnosis not present

## 2024-03-02 DIAGNOSIS — B351 Tinea unguium: Secondary | ICD-10-CM | POA: Diagnosis not present

## 2024-03-02 DIAGNOSIS — M79671 Pain in right foot: Secondary | ICD-10-CM

## 2024-03-02 NOTE — Progress Notes (Signed)
 Patient presents for evaluation and treatment of tenderness and some redness around nails feet.  Tenderness around toes with walking and wearing shoes.  Physical exam:  General appearance: Alert, pleasant, and in no acute distress.  Vascular: Pedal pulses: DP 2/4 B/L, PT 0/4 B/L.  Moderate edema lower legs bilaterally  Neurological:    Dermatologic:  Nails thickened, disfigured, discolored 1-5 BL with subungual debris.  Redness and hypertrophic nail folds along nail folds bilaterally but no signs of drainage or infection.  Musculoskeletal:     Diagnosis: 1. Painful onychomycotic nails 1 through 5 bilaterally. 2. Pain toes 1 through 5 bilaterally. 3.  Type 2 diabetes with PVD.  Plan: Debrided onychomycotic nails 1 through 5 bilaterally.  Return 3 months [facility]

## 2024-03-09 DIAGNOSIS — G4733 Obstructive sleep apnea (adult) (pediatric): Secondary | ICD-10-CM | POA: Diagnosis not present

## 2024-03-18 ENCOUNTER — Other Ambulatory Visit: Payer: Self-pay

## 2024-03-18 ENCOUNTER — Other Ambulatory Visit: Payer: Self-pay | Admitting: Neurology

## 2024-03-18 DIAGNOSIS — G250 Essential tremor: Secondary | ICD-10-CM

## 2024-03-18 MED ORDER — PRIMIDONE 50 MG PO TABS: ORAL_TABLET | ORAL | 0 refills | Status: DC

## 2024-03-31 DIAGNOSIS — G4733 Obstructive sleep apnea (adult) (pediatric): Secondary | ICD-10-CM | POA: Diagnosis not present

## 2024-04-02 DIAGNOSIS — G4733 Obstructive sleep apnea (adult) (pediatric): Secondary | ICD-10-CM | POA: Diagnosis not present

## 2024-04-30 DIAGNOSIS — G4733 Obstructive sleep apnea (adult) (pediatric): Secondary | ICD-10-CM | POA: Diagnosis not present

## 2024-06-03 ENCOUNTER — Encounter: Payer: Self-pay | Admitting: Podiatry

## 2024-06-03 ENCOUNTER — Ambulatory Visit (INDEPENDENT_AMBULATORY_CARE_PROVIDER_SITE_OTHER): Admitting: Podiatry

## 2024-06-03 DIAGNOSIS — M79672 Pain in left foot: Secondary | ICD-10-CM

## 2024-06-03 DIAGNOSIS — M79671 Pain in right foot: Secondary | ICD-10-CM | POA: Diagnosis not present

## 2024-06-03 DIAGNOSIS — B351 Tinea unguium: Secondary | ICD-10-CM

## 2024-06-03 NOTE — Progress Notes (Signed)
 Patient presents for evaluation and treatment of tenderness and some redness around nails feet.  Tenderness around toes with walking and wearing shoes.  Physical exam:  General appearance: Alert, pleasant, and in no acute distress.  Vascular: Pedal pulses: DP 2/4 B/L, PT 0/4 B/L.  Moderate edema lower legs bilaterally.  Capillary refill time immediate bilaterally  Neurologic:  Dermatologic:  Nails thickened, disfigured, discolored 1-5 BL with subungual debris.  Redness and hypertrophic nail folds along nail folds bilaterally but no signs of drainage or infection.  Musculoskeletal:     Diagnosis: 1. Painful onychomycotic nails 1 through 5 bilaterally. 2. Pain toes 1 through 5 bilaterally.  Plan: -Debrided onychomycotic nails 1 through 5 bilaterally.  Sharply debrided nails with nail clipper and reduced with a power bur.  Return 3 months [facility]

## 2024-06-19 ENCOUNTER — Other Ambulatory Visit: Payer: Self-pay | Admitting: Neurology

## 2024-06-19 DIAGNOSIS — G250 Essential tremor: Secondary | ICD-10-CM

## 2024-06-22 NOTE — Progress Notes (Unsigned)
 Assessment/Plan:    1.  Essential Tremor  -Continue primidone , 50 mg, 3 tablets in the morning, 2 tablets at bed.   -Patient used to be on propranolol, but this was discontinued by primary care when he was started on Coreg.  -***Patient now plans to undergo focused ultrasound in [place].  We discussed risks and benefits of this extensively.  He has talked this over with the surgeon in [place] as well.  He plans to do the right hand first.  The left hand is much worse, but he is right-hand dominant.  He and I did discuss that he is not to stop the primidone  cold turkey, but is to be weaned off of it after surgery if he wants.  He knows that he cannot have both hands done through focused ultrasound at the same time and would need at least 9 months between the 2.  -[employer] was declined by insurance.  Even so, now that I have seen the few patients have it, I have not been impressed with the technology of the device, as it has been failing.  -pt certainly could try gabapentin but doubtful it will be impactful for the degree of tremor that he has  2.  Obstructive sleep apnea  -Patient on CPAP  3. ***  Subjective:   Evan Willis was seen today in follow up for essential tremor.  My previous records were reviewed prior to todays visit.  He was last seen about a year ago.  Still on primidone  faithfully.  When I saw the patient last visit, he told me that he planned to undergo focused ultrasound in [place], and then potentially wean off of the primidone .  Ultimately, patient has not had that done.  Current prescribed movement disorder medications:  Primidone , 50 mg, 3 tablets in the morning, 2 tablets at bed  PREVIOUS MEDICATIONS: Propranolol 80 mg twice per day (primary care discontinued when they added Coreg, 12.5 mg twice per day)  ALLERGIES:  No Known Allergies  CURRENT MEDICATIONS:  Outpatient Encounter Medications as of 06/24/2024  Medication Sig   allopurinol (ZYLOPRIM) 100  MG tablet Take 100 mg by mouth 2 (two) times daily.    amLODipine-atorvastatin (CADUET) 10-40 MG per tablet Take 1 tablet by mouth daily.   carvedilol (COREG) 12.5 MG tablet Take 1 tablet by mouth in the morning and at bedtime.   chlorthalidone (HYGROTON) 25 MG tablet    diclofenac Sodium (VOLTAREN) 1 % GEL As needed   doxazosin (CARDURA) 8 MG tablet Take 8 mg by mouth daily.   glipiZIDE (GLUCOTROL) 5 MG tablet Take 2.5 mg by mouth daily before breakfast.    lisinopril (PRINIVIL,ZESTRIL) 40 MG tablet Take 40 mg by mouth daily.   metFORMIN (GLUCOPHAGE) 1000 MG tablet Take 1,000 mg by mouth 2 (two) times daily with a meal.    Multiple Vitamin (MULTIVITAMIN WITH MINERALS) TABS tablet Take 1 tablet by mouth daily.   pioglitazone (ACTOS) 15 MG tablet Take 15 mg by mouth daily.   potassium chloride (MICRO-K) 10 MEQ CR capsule TK 1 C  PO QD   primidone  (MYSOLINE ) 50 MG tablet TAKE 3 TABLETS BY MOUTH EVERY MORNING AND 2 TABLETS AT NIGHT AS DIRECTED   spironolactone (ALDACTONE) 25 MG tablet Take 25 mg by mouth daily.   No facility-administered encounter medications on file as of 06/24/2024.     Objective:    PHYSICAL EXAMINATION:    VITALS:   There were no vitals filed for this visit.  Wt Readings from Last 3 Encounters:  09/23/23 246 lb (111.6 kg)  06/24/23 243 lb (110.2 kg)  06/24/22 257 lb (116.6 kg)    GEN:  The patient appears stated age and is in NAD. HEENT:  Normocephalic, atraumatic.  The mucous membranes are moist. The superficial temporal arteries are without ropiness or tenderness. Cardiovascular: Regular rate rhythm Lungs: Clear to auscultation bilaterally   Neurological examination:  Orientation: The patient is alert and oriented x3. Cranial nerves: There is good facial symmetry. The speech is fluent and clear. Soft palate rises symmetrically and there is no tongue deviation. Hearing is intact to conversational tone. Sensation: Sensation is intact to light touch  throughout Motor: Strength is at least antigravity x4.  Movement examination: Tone: There is normal tone in the UE/LE Abnormal movements: no rest tremor.  He has very little postural tremor on the right.  Not much intention tremor on the right today either.  He has at least moderate intention tremor on the left.   Coordination:  There is no decremation with RAM's  I have reviewed and interpreted the following labs independently   Chemistry      Component Value Date/Time   NA 141 06/05/2010 1220   K 4.1 06/05/2010 1220   CL 102 06/05/2010 1220   CO2 29 06/05/2010 1220   BUN 12 06/05/2010 1220   CREATININE 0.97 06/05/2010 1220      Component Value Date/Time   CALCIUM 9.4 06/05/2010 1220   ALKPHOS 55 06/05/2010 1220   AST 20 06/05/2010 1220   ALT 18 06/05/2010 1220   BILITOT 0.7 06/05/2010 1220      Lab Results  Component Value Date   WBC 6.6 06/05/2010   HGB 13.9 06/05/2010   HCT 42.2 06/05/2010   MCV 85.5 06/05/2010   PLT 205 06/05/2010   Lab Results  Component Value Date   TSH 0.982 11/30/2013     Chemistry      Component Value Date/Time   NA 141 06/05/2010 1220   K 4.1 06/05/2010 1220   CL 102 06/05/2010 1220   CO2 29 06/05/2010 1220   BUN 12 06/05/2010 1220   CREATININE 0.97 06/05/2010 1220      Component Value Date/Time   CALCIUM 9.4 06/05/2010 1220   ALKPHOS 55 06/05/2010 1220   AST 20 06/05/2010 1220   ALT 18 06/05/2010 1220   BILITOT 0.7 06/05/2010 1220     Total time spent on today's visit was *** minutes, including both face-to-face time and nonface-to-face time.  Time included that spent on review of records (prior notes available to me/labs/imaging if pertinent), discussing treatment and goals, answering patient's questions and coordinating care.   Cc:  Regino Slater, MD

## 2024-06-24 ENCOUNTER — Ambulatory Visit: Payer: Self-pay | Admitting: Neurology

## 2024-06-24 VITALS — BP 144/92 | HR 68 | Wt 246.0 lb

## 2024-06-24 DIAGNOSIS — G4733 Obstructive sleep apnea (adult) (pediatric): Secondary | ICD-10-CM | POA: Diagnosis not present

## 2024-06-24 DIAGNOSIS — G250 Essential tremor: Secondary | ICD-10-CM

## 2024-06-24 MED ORDER — PRIMIDONE 50 MG PO TABS: ORAL_TABLET | ORAL | 2 refills | Status: AC

## 2024-06-24 NOTE — Patient Instructions (Signed)
°  VISIT SUMMARY: Today, you came in for a follow-up visit to discuss potential focused ultrasound treatment for your essential tremor. You are currently managing your symptoms with primidone  and are considering focused ultrasound treatment targeting the left brain to address tremors in your right hand.  YOUR PLAN: -ESSENTIAL TREMOR: Essential tremor is a nervous system disorder that causes involuntary and rhythmic shaking, most commonly in the hands. You have chronic essential tremor affecting both hands, with the left being more severe. You are considering focused ultrasound treatment for your right hand, which involves targeting the left brain. Continue taking primidone  50 mg, three tablets every morning and two tablets at night. Your primidone  prescription has been refilled at [facility].  INSTRUCTIONS: Consider focused ultrasound treatment for your right hand in [place].                      Contains text generated by [employer].                                 Contains text generated by [employer].

## 2024-09-02 ENCOUNTER — Ambulatory Visit: Admitting: Podiatry

## 2025-03-25 ENCOUNTER — Ambulatory Visit: Payer: Self-pay | Admitting: Neurology
